# Patient Record
Sex: Female | Born: 1980 | Race: White | Hispanic: No | Marital: Married | Smoking: Never smoker
Health system: Southern US, Community
[De-identification: ages and names within clinical notes are randomized; demographics above are authoritative.]

## PROBLEM LIST (undated history)

## (undated) DIAGNOSIS — O09523 Supervision of elderly multigravida, third trimester: Secondary | ICD-10-CM

## (undated) DIAGNOSIS — O039 Complete or unspecified spontaneous abortion without complication: Secondary | ICD-10-CM

## (undated) DIAGNOSIS — O26643 Intrahepatic cholestasis of pregnancy, third trimester: Secondary | ICD-10-CM

## (undated) DIAGNOSIS — Z98891 History of uterine scar from previous surgery: Secondary | ICD-10-CM

## (undated) DIAGNOSIS — D649 Anemia, unspecified: Secondary | ICD-10-CM

## (undated) DIAGNOSIS — E039 Hypothyroidism, unspecified: Secondary | ICD-10-CM

## (undated) DIAGNOSIS — K219 Gastro-esophageal reflux disease without esophagitis: Secondary | ICD-10-CM

## (undated) DIAGNOSIS — E079 Disorder of thyroid, unspecified: Secondary | ICD-10-CM

## (undated) DIAGNOSIS — O163 Unspecified maternal hypertension, third trimester: Secondary | ICD-10-CM

## (undated) HISTORY — PX: OTHER SURGICAL HISTORY: SHX169

---

## 2008-04-14 DIAGNOSIS — E039 Hypothyroidism, unspecified: Secondary | ICD-10-CM | POA: Insufficient documentation

## 2019-02-20 ENCOUNTER — Ambulatory Visit
Admission: EM | Admit: 2019-02-20 | Discharge: 2019-02-20 | Disposition: A | Discharge status: Home / Self Care | Payer: PRIVATE HEALTH INSURANCE

## 2019-02-20 ENCOUNTER — Encounter: Payer: Self-pay | Admitting: Gynecology

## 2019-02-20 ENCOUNTER — Ambulatory Visit (INDEPENDENT_AMBULATORY_CARE_PROVIDER_SITE_OTHER): Payer: PRIVATE HEALTH INSURANCE

## 2019-02-20 ENCOUNTER — Other Ambulatory Visit: Payer: Self-pay

## 2019-02-20 DIAGNOSIS — M7531 Calcific tendinitis of right shoulder: Secondary | ICD-10-CM | POA: Diagnosis not present

## 2019-02-20 DIAGNOSIS — M25511 Pain in right shoulder: Secondary | ICD-10-CM | POA: Diagnosis not present

## 2019-02-20 DIAGNOSIS — Y9342 Activity, yoga: Secondary | ICD-10-CM | POA: Diagnosis not present

## 2019-02-20 HISTORY — DX: Complete or unspecified spontaneous abortion without complication: O03.9

## 2019-02-20 HISTORY — DX: Disorder of thyroid, unspecified: E07.9

## 2019-02-20 MED ORDER — MELOXICAM 15 MG PO TABS: 15.0000 mg | ORAL_TABLET | Freq: Every day | ORAL | 0 refills | Status: DC

## 2019-02-20 NOTE — ED Provider Notes (Signed)
[facility]    CSN: [id number] Arrival date & time: 02/20/19  2035     History   Chief Complaint Chief Complaint  Patient presents with  . Arm Pain    HPI Alexandra [name] is a 38 y.o. female.   HPI  38 year old female presents with a right shoulder pain.  She states that she was at yoga class around 3 days ago when she came home noticed pain in her right upper arm.  She indicates over the anterior biceps but there is no tenderness there.  Difficulty abducting her arm and also extending her shoulder.  No numbness or tingling.  She states that her neck is nice and comfortable.  Did not have a specific injury during yoga class and cannot remember anything other than yoga class which might have caused her to have her pain.        Past Medical History:  Diagnosis Date  . Miscarriage   . Thyroid disease     There are no active problems to display for this patient.   History reviewed. No pertinent surgical history.  OB History   No obstetric history on file.      Home Medications    Prior to Admission medications   Medication Sig Start Date End Date Taking? Authorizing Provider  levothyroxine (SYNTHROID, LEVOTHROID) 75 MCG tablet Take by mouth. 04/19/18 09/20/19 Yes [provider]  meloxicam (MOBIC) 15 MG tablet Take 1 tablet (15 mg total) by mouth daily. 02/20/19   Lutricia Feil, PA-C    Family History Family History  Problem Relation Age of Onset  . Hypertension Father     Social History Social History   Tobacco Use  . Smoking status: Never Smoker  . Smokeless tobacco: Never Used  Substance Use Topics  . Alcohol use: Never    Frequency: Never  . Drug use: Never     Allergies   Patient has no known allergies.   Review of Systems Review of Systems  Constitutional: Positive for activity change. Negative for appetite change, chills, fatigue and fever.  Musculoskeletal: Positive for myalgias.  All other systems reviewed and are  negative.    Physical Exam Triage Vital Signs ED Triage Vitals  Enc Vitals Group     BP 02/20/19 0848 133/87     Pulse Rate 02/20/19 0848 85     Resp 02/20/19 0848 16     Temp 02/20/19 0848 98 F (36.7 C)     Temp Source 02/20/19 0848 Oral     SpO2 02/20/19 0848 100 %     Weight 02/20/19 0848 188 lb (85.3 kg)     Height 02/20/19 0848 5\' 5"  (1.651 m)     Head Circumference --      Peak Flow --      Pain Score 02/20/19 0847 7     Pain Loc --      Pain Edu? --      Excl. in GC? --    No data found.  Updated Vital Signs BP 133/87 (BP Location: Left Arm)   Pulse 85   Temp 98 F (36.7 C) (Oral)   Resp 16   Ht 5\' 5"  (1.651 m)   Wt 188 lb (85.3 kg)   LMP 01/15/2019 Comment: miscarriage  SpO2 100%   BMI 31.28 kg/m   Visual Acuity Right Eye Distance:   Left Eye Distance:   Bilateral Distance:    Right Eye Near:   Left Eye Near:  Bilateral Near:     Physical Exam Vitals signs and nursing note reviewed.  Constitutional:      General: She is not in acute distress.    Appearance: Normal appearance. She is not ill-appearing, toxic-appearing or diaphoretic.  HENT:     Head: Normocephalic and atraumatic.     Nose: Nose normal.     Mouth/Throat:     Mouth: Mucous membranes are moist.     Pharynx: Oropharynx is clear.  Eyes:     General:        Right eye: No discharge.        Left eye: No discharge.     Conjunctiva/sclera: Conjunctivae normal.  Neck:     Musculoskeletal: Normal range of motion and neck supple. No muscular tenderness.  Musculoskeletal:        General: Tenderness present.     Comments: Exam of the right shoulder shows range of motion.  Discomfort at the extremes of external rotation particularly with extension and abduction.  She has no tenderness or pain along the clavicle or the [facility].  There is no subacromial tenderness.  She has no tenderness along the biceps.  Resisted flexion does not reproduce her pain.  Negative arm drop test.  She has a  negative empty can although she does have discomfort but is able to hold against resistance.  Skin:    General: Skin is warm and dry.  Neurological:     General: No focal deficit present.     Mental Status: She is alert and oriented to person, place, and time.  Psychiatric:        Mood and Affect: Mood normal.        Behavior: Behavior normal.        Thought Content: Thought content normal.        Judgment: Judgment normal.      UC Treatments / Results  Labs (all labs ordered are listed, but only abnormal results are displayed) Labs Reviewed - No data to display  EKG None  Radiology Dg Shoulder Right  Result Date: 02/20/2019 CLINICAL DATA:  Atraumatic right shoulder pain EXAM: RIGHT SHOULDER - 2+ VIEW COMPARISON:  None. FINDINGS: Calcification in the region of the rotator cuff. No fracture, degenerative spurring, or malalignment. IMPRESSION: Calcific tendinitis of the rotator cuff. Electronically Signed   By: Marnee SpringJonathon  Watts M.D.   On: 02/20/2019 10:44    Procedures Procedures (including critical care time)  Medications Ordered in UC Medications - No data to display  Initial Impression / Assessment and Plan / UC Course  I have reviewed the triage vital signs and the nursing notes.  Pertinent labs & imaging results that were available during my care of the patient were reviewed by me and considered in my medical decision making (see chart for details).   Patient has a right rotator cuff calcific tendinitis.  Ice as necessary for comfort.  Warm pendulum exercises until she has full use of her shoulder back.  Her on Mobic with gastric cautions.  Follow-up with orthopedic if she is not improving   Final Clinical Impressions(s) / UC Diagnoses   Final diagnoses:  Calcific tendinitis of right shoulder     Discharge Instructions     Apply ice 20 minutes out of every 2 hours 4-5 times daily for comfort.  Perform by pendulum exercises as described to you 3-4 times daily for 1  to 2 minutes each time.  Avoid symptoms as much as possible.    ED  Prescriptions    Medication Sig Dispense Auth. Provider   meloxicam (MOBIC) 15 MG tablet Take 1 tablet (15 mg total) by mouth daily. 30 tablet Lutricia Feil, PA-C     Controlled Substance Prescriptions [place] Controlled Substance Registry consulted? Not Applicable   Lutricia Feil, PA-C 02/20/19 1109

## 2019-02-20 NOTE — Discharge Instructions (Addendum)
Apply ice 20 minutes out of every 2 hours 4-5 times daily for comfort.  Perform by pendulum exercises as described to you 3-4 times daily for 1 to 2 minutes each time.  Avoid symptoms as much as possible.

## 2019-02-20 NOTE — ED Triage Notes (Signed)
Per patient at yoga class x 3 days ago. Per patient after getting home notice pain in right upper arm.

## 2019-06-09 ENCOUNTER — Other Ambulatory Visit: Payer: PRIVATE HEALTH INSURANCE

## 2019-06-09 ENCOUNTER — Ambulatory Visit (INDEPENDENT_AMBULATORY_CARE_PROVIDER_SITE_OTHER)
Admission: RE | Admit: 2019-06-09 | Discharge: 2019-06-09 | Disposition: A | Discharge status: Home / Self Care | Payer: PRIVATE HEALTH INSURANCE | Source: Ambulatory Visit

## 2019-06-09 ENCOUNTER — Telehealth: Payer: Self-pay | Admitting: *Deleted

## 2019-06-09 DIAGNOSIS — R197 Diarrhea, unspecified: Secondary | ICD-10-CM | POA: Diagnosis not present

## 2019-06-09 DIAGNOSIS — Z20822 Contact with and (suspected) exposure to covid-19: Secondary | ICD-10-CM

## 2019-06-09 DIAGNOSIS — R059 Cough, unspecified: Secondary | ICD-10-CM

## 2019-06-09 DIAGNOSIS — R05 Cough: Secondary | ICD-10-CM | POA: Diagnosis not present

## 2019-06-09 MED ORDER — CETIRIZINE HCL 10 MG PO TABS: 10.0000 mg | ORAL_TABLET | Freq: Every day | ORAL | 0 refills | Status: DC

## 2019-06-09 MED ORDER — ONDANSETRON 4 MG PO TBDP: 4.0000 mg | ORAL_TABLET | Freq: Three times a day (TID) | ORAL | 0 refills | Status: DC | PRN

## 2019-06-09 NOTE — Discharge Instructions (Addendum)
We are sending you for COVID testing They will be calling you to set up a time Make sure you are quarantining and taking precautions until we get your results This could be allergy related as we spoke about.  You could try taking an allergy medicine over-the-counter to include Zyrtec or Allegra Zofran as needed for nausea, vomiting Make sure drinking fluids and staying hydrated For continued or worsening symptoms you need to be seen in person

## 2019-06-09 NOTE — Telephone Encounter (Signed)
-----   Message from Orvan July, NP sent at 06/09/2019  2:38 PM EDT ----- Patient with dry cough, diarrhea and body aches Lives in [place] would like [place] testing site

## 2019-06-09 NOTE — ED Provider Notes (Signed)
Virtual Visit via Video Note:  Alexandra [name]  initiated request for Telemedicine visit with [facility]gent [facility] team. I connected with Alexandra [name]  on 06/09/2019 at 2:41 PM  for a synchronized telemedicine visit using a video enabled HIPPA compliant telemedicine application. I verified that I am speaking with Alexandra [name]  using two identifiers. Alexandra July, NP  was physically located in a [facility] Urgent care site and Alexandra [name] was located at a different location.   The limitations of evaluation and management by telemedicine as well as the availability of in-person appointments were discussed. Patient was informed that she  may incur a bill ( including co-pay) for this virtual visit encounter. Alexandra [name]  expressed understanding and gave verbal consent to proceed with virtual visit.     History of Present Illness:Alexandra [name]  is a 38 y.o. female presents with upper abdominal cramping, diarrhea, dry cough, back pain, nausea.  Symptoms have been present, waxing waning over the past 3 days.  She has also had some postnasal drip, mild itchy scratchy throat.  Denies any history of allergies.  No vomiting.  No recent sick contacts or recent traveling.  She has not take anything for her symptoms.  Denies any known COVID exposure.  Past Medical History:  Diagnosis Date  . Miscarriage   . Thyroid disease     No Known Allergies      Observations/Objective:GENERAL APPEARANCE: Well developed, well nourished, alert and cooperative, and appears to be in no acute distress. HEAD: normocephalic. Non labored breathing, no dyspnea or distress Skin: Skin normal color  PSYCHIATRIC: The mental examination revealed the patient was oriented to person, place, and time. The patient was able to demonstrate good judgement and reason, without hallucinations, abnormal affect or abnormal behaviors during the examination. Patient is not suicidal     Assessment and Plan: Based on dry cough and  diarrhea and other symptoms we will send for COVID testing.  Not convinced of this.  Not convinced of any infectious disease.  Recommended that if symptoms continue she will need to be seen in person. Symptoms could be allergy related Recommended taking some Zyrtec daily to see if this helps.  Also sent Zofran for nausea, vomiting Quarantine precautions given   Follow Up Instructions: Follow up as needed for continued or worsening symptoms     I discussed the assessment and treatment plan with the patient. The patient was provided an opportunity to ask questions and all were answered. The patient agreed with the plan and demonstrated an understanding of the instructions.   The patient was advised to call back or seek an in-person evaluation if the symptoms worsen or if the condition fails to improve as anticipated.      Alexandra July, NP  06/09/2019 2:41 PM         Alexandra July, NP 06/09/19 0102

## 2019-06-09 NOTE — Telephone Encounter (Signed)
Scheduled patient for appointment today at 3:15 pm at [facility].  Testing protocol reviewed.

## 2019-06-13 LAB — NOVEL CORONAVIRUS, NAA: SARS-CoV-2, NAA: NOT DETECTED

## 2019-12-13 LAB — OB RESULTS CONSOLE RUBELLA ANTIBODY, IGM: Rubella: IMMUNE

## 2019-12-13 LAB — OB RESULTS CONSOLE VARICELLA ZOSTER ANTIBODY, IGG: Varicella: IMMUNE

## 2019-12-13 LAB — OB RESULTS CONSOLE HEPATITIS B SURFACE ANTIGEN: Hepatitis B Surface Ag: NEGATIVE

## 2020-03-09 ENCOUNTER — Ambulatory Visit: Payer: PRIVATE HEALTH INSURANCE | Attending: Internal Medicine

## 2020-03-09 DIAGNOSIS — Z23 Encounter for immunization: Secondary | ICD-10-CM

## 2020-03-09 NOTE — Progress Notes (Signed)
   Covid-19 Vaccination Clinic  Name:  Roderica Cathell    MRN: [id number] DOB: Feb 10, 1981  03/09/2020  Ms. Launer was observed post Covid-19 immunization for 15 minutes without incident. She was provided with Vaccine Information Sheet and instruction to access the V-Safe system.   Ms. Edgington was instructed to call 911 with any severe reactions post vaccine: Marland Kitchen Difficulty breathing  . Swelling of face and throat  . A fast heartbeat  . A bad rash all over body  . Dizziness and weakness   Immunizations Administered    Name Date Dose VIS Date Route   Pfizer COVID-19 Vaccine 03/09/2020 10:25 AM 0.3 mL 11/25/2019 Intramuscular   Manufacturer: [employer], [employer]   Lot: [id number]   NDC: [address]-0

## 2020-04-03 ENCOUNTER — Ambulatory Visit: Payer: PRIVATE HEALTH INSURANCE | Attending: Internal Medicine

## 2020-04-03 DIAGNOSIS — Z23 Encounter for immunization: Secondary | ICD-10-CM

## 2020-04-03 NOTE — Progress Notes (Signed)
   Covid-19 Vaccination Clinic  Name:  Alexandra [name]    MRN: [id number] DOB: 1981-05-04  04/03/2020  [name] was observed post Covid-19 immunization for 15 minutes without incident. She was provided with Vaccine Information Sheet and instruction to access the V-Safe system.   [name] was instructed to call 911 with any severe reactions post vaccine: Marland Kitchen Difficulty breathing  . Swelling of face and throat  . A fast heartbeat  . A bad rash all over body  . Dizziness and weakness   Immunizations Administered    Name Date Dose VIS Date Route   Pfizer COVID-19 Vaccine 04/03/2020  8:39 AM 0.3 mL 02/08/2019 Intramuscular   Manufacturer: [employer], [employer]   Lot: [id number]   NDC: [address]-2

## 2020-05-22 ENCOUNTER — Other Ambulatory Visit: Payer: Self-pay

## 2020-05-22 ENCOUNTER — Observation Stay
Admission: RE | Admit: 2020-05-22 | Discharge: 2020-05-22 | Disposition: A | Discharge status: Home / Self Care | Payer: Commercial Managed Care - PPO | Attending: Obstetrics and Gynecology | Admitting: Obstetrics and Gynecology

## 2020-05-22 DIAGNOSIS — Z8249 Family history of ischemic heart disease and other diseases of the circulatory system: Secondary | ICD-10-CM | POA: Diagnosis not present

## 2020-05-22 DIAGNOSIS — E039 Hypothyroidism, unspecified: Secondary | ICD-10-CM | POA: Diagnosis not present

## 2020-05-22 DIAGNOSIS — Z79890 Hormone replacement therapy (postmenopausal): Secondary | ICD-10-CM | POA: Insufficient documentation

## 2020-05-22 DIAGNOSIS — O99283 Endocrine, nutritional and metabolic diseases complicating pregnancy, third trimester: Secondary | ICD-10-CM | POA: Diagnosis not present

## 2020-05-22 DIAGNOSIS — Z3A35 35 weeks gestation of pregnancy: Secondary | ICD-10-CM | POA: Insufficient documentation

## 2020-05-22 DIAGNOSIS — O133 Gestational [pregnancy-induced] hypertension without significant proteinuria, third trimester: Secondary | ICD-10-CM | POA: Diagnosis not present

## 2020-05-22 DIAGNOSIS — R030 Elevated blood-pressure reading, without diagnosis of hypertension: Secondary | ICD-10-CM | POA: Diagnosis present

## 2020-05-22 DIAGNOSIS — Z79899 Other long term (current) drug therapy: Secondary | ICD-10-CM | POA: Diagnosis not present

## 2020-05-22 DIAGNOSIS — O0993 Supervision of high risk pregnancy, unspecified, third trimester: Secondary | ICD-10-CM | POA: Insufficient documentation

## 2020-05-22 DIAGNOSIS — Z7982 Long term (current) use of aspirin: Secondary | ICD-10-CM | POA: Insufficient documentation

## 2020-05-22 HISTORY — DX: Hypothyroidism, unspecified: E03.9

## 2020-05-22 LAB — COMPREHENSIVE METABOLIC PANEL
ALT: 11 U/L (ref 0–44)
AST: 18 U/L (ref 15–41)
Albumin: 3 g/dL — ABNORMAL LOW (ref 3.5–5.0)
Alkaline Phosphatase: 109 U/L (ref 38–126)
Anion gap: 6 (ref 5–15)
BUN: 9 mg/dL (ref 6–20)
CO2: 21 mmol/L — ABNORMAL LOW (ref 22–32)
Calcium: 8.7 mg/dL — ABNORMAL LOW (ref 8.9–10.3)
Chloride: 109 mmol/L (ref 98–111)
Creatinine, Ser: 0.76 mg/dL (ref 0.44–1.00)
GFR calc Af Amer: >60 mL/min (ref >60)
GFR calc non Af Amer: >60 mL/min (ref >60)
Glucose, Bld: 83 mg/dL (ref 70–99)
Potassium: 3.9 mmol/L (ref 3.5–5.1)
Sodium: 136 mmol/L (ref 135–145)
Total Bilirubin: 0.5 mg/dL (ref 0.3–1.2)
Total Protein: 5.6 g/dL — ABNORMAL LOW (ref 6.5–8.1)

## 2020-05-22 LAB — PROTEIN / CREATININE RATIO, URINE
Creatinine, Urine: 89 mg/dL
Total Protein, Urine: <6 mg/dL

## 2020-05-22 LAB — CBC
HCT: 34.8 % — ABNORMAL LOW (ref 36.0–46.0)
Hemoglobin: 12 g/dL (ref 12.0–15.0)
MCH: 31.7 pg (ref 26.0–34.0)
MCHC: 34.5 g/dL (ref 30.0–36.0)
MCV: 91.8 fL (ref 80.0–100.0)
Platelets: 217 10*3/uL (ref 150–400)
RBC: 3.79 MIL/uL — ABNORMAL LOW (ref 3.87–5.11)
RDW: 12.4 % (ref 11.5–15.5)
WBC: 7.2 10*3/uL (ref 4.0–10.5)
nRBC: 0 % (ref 0.0–0.2)

## 2020-05-22 MED ORDER — DOCUSATE SODIUM 100 MG PO CAPS: 100.0000 mg | ORAL_CAPSULE | Freq: Every day | ORAL | Status: DC

## 2020-05-22 MED ORDER — ACETAMINOPHEN 325 MG PO TABS: 650.0000 mg | ORAL_TABLET | ORAL | Status: DC | PRN

## 2020-05-22 MED ORDER — CALCIUM CARBONATE ANTACID 500 MG PO CHEW: 2.0000 | CHEWABLE_TABLET | ORAL | Status: DC | PRN

## 2020-05-22 MED ORDER — ZOLPIDEM TARTRATE 5 MG PO TABS: 5.0000 mg | ORAL_TABLET | Freq: Every evening | ORAL | Status: DC | PRN

## 2020-05-22 MED ORDER — PRENATAL MULTIVITAMIN CH: 1.0000 | ORAL_TABLET | Freq: Every day | ORAL | Status: DC

## 2020-05-22 NOTE — Discharge Summary (Signed)
Alexandra [name] is a 39 y.o. female. She is at [redacted]w[redacted]d gestation. Patient's last menstrual period was 09/17/2019. Estimated Date of Delivery: 06/23/20  Prenatal care site: [facility]  Current pregnancy complicated by:  1. Gestational Hypertension noted at [redacted]w[redacted]d  NST and Growth u/s done 05/18/20  NST and AFI on 05/22/20  2. IVF Pregnancy  Embryo transfer on 10/06/2019, EDD 06/23/2020  PGS performed, negative  [x ] fetal echo 22w- WNL on 02/16/20 2. Prepregnancy BMI 32   11-20 pounds TWG  P/C ratio, A1c, and CMP added to new OB labs 3. Advanced maternal age  Will be 58 at delivery  PGS normal  Baby ASA for risk factors- restart 1/26 4. Thyroid disease  Currently taking 151mcg Synthroid daily  [x]  TSH first trimester: 0.389 at [redacted]w[redacted]d 12/13/2019  Recheck at next visit per CCW [1/26: 1.033 ]   [x]  TSH second trimester 02/07/20 1.661  03/22/2020 1.111 [redacted]w[redacted]d  [ x] TSH third trimester 1.209  5. --> gestational hypertension  05/02/2020: Normal P/C 94, CMP and CBC  Given Pre-e precautions.   05/11/2020: CBC, CMP, and P/C repeated  Growth scan ordered for next visit 05/11/2020  Reviewed delivery at [redacted]w[redacted]d 6. Elevated 1h OGTT  153  3h WNL 98, 169, 129, 92  Chief complaint: sent from office for elevated BP.    S: Resting comfortably. no CTX, no VB.no LOF,  Active fetal movement.  Denies: HA, visual changes, SOB, or RUQ/epigastric pain  Maternal Medical History:   Past Medical History:  Diagnosis Date  . Hypothyroidism   . Miscarriage   . Thyroid disease     Past Surgical History:  Procedure Laterality Date  . Fertility Treatments      No Known Allergies  Prior to Admission medications   Medication Sig Start Date End Date Taking? Authorizing Provider  aspirin 81 MG chewable tablet Chew by mouth daily.   Yes [provider]  levothyroxine (SYNTHROID) 100 MCG tablet Take 100 mcg by mouth daily before breakfast.   Yes [provider]   Prenatal Vit-Fe Fumarate-FA (PRENATAL MULTIVITAMIN) TABS tablet Take 1 tablet by mouth daily at 12 noon.   Yes [provider]  cetirizine (ZYRTEC) 10 MG tablet Take 1 tablet (10 mg total) by mouth daily. Patient not taking: Reported on 05/22/2020 06/09/19   Loura Halt A, NP  levothyroxine (SYNTHROID, LEVOTHROID) 75 MCG tablet Take by mouth. 04/19/18 09/20/19  [provider]  meloxicam (MOBIC) 15 MG tablet Take 1 tablet (15 mg total) by mouth daily. Patient not taking: Reported on 05/22/2020 02/20/19   Crecencio Mc P, PA-C  ondansetron (ZOFRAN ODT) 4 MG disintegrating tablet Take 1 tablet (4 mg total) by mouth every 8 (eight) hours as needed for nausea or vomiting. Patient not taking: Reported on 05/22/2020 06/09/19   Orvan July, NP      Social History: She  reports that she has never smoked. She has never used smokeless tobacco. She reports that she does not drink alcohol or use drugs.  Family History: family history includes Hypertension in her father.   Review of Systems: A full review of systems was performed and negative except as noted in the HPI.     O:  BP (!) 129/93 (BP Location: Left Arm)   Pulse 63   Resp 17   LMP 09/17/2019  Results for orders placed or performed during the hospital encounter of 05/22/20 (from the past 48 hour(s))  Comprehensive metabolic panel   Collection Time: 05/22/20 12:57  PM  Result Value Ref Range   Sodium 136 135 - 145 mmol/L   Potassium 3.9 3.5 - 5.1 mmol/L   Chloride 109 98 - 111 mmol/L   CO2 21 (L) 22 - 32 mmol/L   Glucose, Bld 83 70 - 99 mg/dL   BUN 9 6 - 20 mg/dL   Creatinine, Ser 6.43 0.44 - 1.00 mg/dL   Calcium 8.7 (L) 8.9 - 10.3 mg/dL   Total Protein 5.6 (L) 6.5 - 8.1 g/dL   Albumin 3.0 (L) 3.5 - 5.0 g/dL   AST 18 15 - 41 U/L   ALT 11 0 - 44 U/L   Alkaline Phosphatase 109 38 - 126 U/L   Total Bilirubin 0.5 0.3 - 1.2 mg/dL   GFR calc non Af Amer >60 >60 mL/min   GFR calc Af Amer >60 >60 mL/min   Anion gap 6 5 - 15   CBC on admission   Collection Time: 05/22/20 12:57 PM  Result Value Ref Range   WBC 7.2 4.0 - 10.5 K/uL   RBC 3.79 (L) 3.87 - 5.11 MIL/uL   Hemoglobin 12.0 12.0 - 15.0 g/dL   HCT 32.9 (L) 51.8 - 84.1 %   MCV 91.8 80.0 - 100.0 fL   MCH 31.7 26.0 - 34.0 pg   MCHC 34.5 30.0 - 36.0 g/dL   RDW 66.0 63.0 - 16.0 %   Platelets 217 150 - 400 K/uL   nRBC 0.0 0.0 - 0.2 %  Protein / creatinine ratio, urine   Collection Time: 05/22/20  1:01 PM  Result Value Ref Range   Creatinine, Urine 89 mg/dL   Total Protein, Urine <6 mg/dL   Protein Creatinine Ratio        0.00 - 0.15 mg/mg[Cre]     Constitutional: NAD, AAOx3  HE/ENT: extraocular movements grossly intact, moist mucous membranes CV: RRR PULM: nl respiratory effort, CTABL     Abd: gravid, non-tender, non-distended, soft      Ext: Non-tender, Nonedematous   Psych: mood appropriate, speech normal Pelvic: deferred  Fetal  monitoring: Cat I Appropriate for GA Baseline: 135bpm Variability: moderate Accelerations: present x >2 Decelerations absent Time    A/P: 39 y.o. [redacted]w[redacted]d here for antenatal surveillance for GHTN  Principle Diagnosis:  High risk pregnancy in third trimester   Labor: not present.   Fetal Wellbeing: Reassuring Cat 1 tracing with Reactive NST   Neg Pre-e labs, mild range BPs.   D/c home stable, precautions reviewed, follow-up as scheduled in 3d for NST/PNC.    Randa Ngo, CNM 05/22/2020  2:40 PM

## 2020-05-29 ENCOUNTER — Other Ambulatory Visit: Payer: Self-pay

## 2020-05-29 LAB — OB RESULTS CONSOLE GC/CHLAMYDIA
Chlamydia: NEGATIVE
Gonorrhea: NEGATIVE

## 2020-05-29 LAB — OB RESULTS CONSOLE GBS: GBS: NEGATIVE

## 2020-05-29 LAB — OB RESULTS CONSOLE HIV ANTIBODY (ROUTINE TESTING): HIV: NONREACTIVE

## 2020-05-29 NOTE — Progress Notes (Signed)
[id number] at [redacted]w[redacted]d, dated by FET Scheduled for induction of labor for gestational HTN on 06/04/20.   Prenatal provider: [facility] Pregnancy complicated by: 1. Gestational HTN 2. IVF pregnancy  3. Prepregnancy BMI 32 4. AMA 5. Thyroid disease  6. Elevated 1hr GTT - 3hr WNL  Prenatal Labs: Blood type/Rh O pos  Antibody screen neg  Rubella Immune  Varicella Immune  RPR NR  HBsAg Neg  HIV NR  GC neg  Chlamydia neg  Genetic screening negative  1 hour GTT 153  3 hour GTT 98, 169, 129, 92  GBS Pending    Flu: 09/16/2019 Tdap: 04/18/2020 Covid Vaccine: 2nd vaccine given 04/03/2020 Contraception: none - no fallopian tubes, used IVF to achieve pregnancy  Feeding preference: breast   ____ Margaretmary Eddy, CNM Certified Nurse Midwife [facility]

## 2020-06-01 ENCOUNTER — Other Ambulatory Visit: Payer: Self-pay

## 2020-06-01 ENCOUNTER — Other Ambulatory Visit
Admission: RE | Admit: 2020-06-01 | Discharge: 2020-06-01 | Disposition: A | Discharge status: Home / Self Care | Payer: Commercial Managed Care - PPO | Source: Ambulatory Visit | Attending: Obstetrics and Gynecology | Admitting: Obstetrics and Gynecology

## 2020-06-01 DIAGNOSIS — Z20822 Contact with and (suspected) exposure to covid-19: Secondary | ICD-10-CM | POA: Insufficient documentation

## 2020-06-01 DIAGNOSIS — Z01812 Encounter for preprocedural laboratory examination: Secondary | ICD-10-CM | POA: Insufficient documentation

## 2020-06-01 LAB — SARS CORONAVIRUS 2 (TAT 6-24 HRS): SARS Coronavirus 2: NEGATIVE

## 2020-06-02 ENCOUNTER — Encounter: Payer: Self-pay | Admitting: Obstetrics and Gynecology

## 2020-06-02 ENCOUNTER — Inpatient Hospital Stay
Admission: AD | Admit: 2020-06-02 | Discharge: 2020-06-06 | DRG: 787 | Disposition: A | Discharge status: Home / Self Care | Payer: Commercial Managed Care - PPO | Attending: Certified Nurse Midwife | Admitting: Certified Nurse Midwife

## 2020-06-02 DIAGNOSIS — D62 Acute posthemorrhagic anemia: Secondary | ICD-10-CM | POA: Diagnosis not present

## 2020-06-02 DIAGNOSIS — Z20822 Contact with and (suspected) exposure to covid-19: Secondary | ICD-10-CM | POA: Diagnosis present

## 2020-06-02 DIAGNOSIS — Z3A37 37 weeks gestation of pregnancy: Secondary | ICD-10-CM | POA: Diagnosis not present

## 2020-06-02 DIAGNOSIS — Z7982 Long term (current) use of aspirin: Secondary | ICD-10-CM

## 2020-06-02 DIAGNOSIS — O9081 Anemia of the puerperium: Secondary | ICD-10-CM | POA: Diagnosis not present

## 2020-06-02 DIAGNOSIS — O134 Gestational [pregnancy-induced] hypertension without significant proteinuria, complicating childbirth: Secondary | ICD-10-CM | POA: Diagnosis present

## 2020-06-02 DIAGNOSIS — Z9889 Other specified postprocedural states: Secondary | ICD-10-CM

## 2020-06-02 DIAGNOSIS — O139 Gestational [pregnancy-induced] hypertension without significant proteinuria, unspecified trimester: Secondary | ICD-10-CM | POA: Diagnosis present

## 2020-06-02 LAB — COMPREHENSIVE METABOLIC PANEL
ALT: 11 U/L (ref 0–44)
AST: 17 U/L (ref 15–41)
Albumin: 2.9 g/dL — ABNORMAL LOW (ref 3.5–5.0)
Alkaline Phosphatase: 139 U/L — ABNORMAL HIGH (ref 38–126)
Anion gap: 8 (ref 5–15)
BUN: 12 mg/dL (ref 6–20)
CO2: 20 mmol/L — ABNORMAL LOW (ref 22–32)
Calcium: 8.8 mg/dL — ABNORMAL LOW (ref 8.9–10.3)
Chloride: 106 mmol/L (ref 98–111)
Creatinine, Ser: 0.8 mg/dL (ref 0.44–1.00)
GFR calc Af Amer: >60 mL/min (ref >60)
GFR calc non Af Amer: >60 mL/min (ref >60)
Glucose, Bld: 104 mg/dL — ABNORMAL HIGH (ref 70–99)
Potassium: 4 mmol/L (ref 3.5–5.1)
Sodium: 134 mmol/L — ABNORMAL LOW (ref 135–145)
Total Bilirubin: 0.6 mg/dL (ref 0.3–1.2)
Total Protein: 5.7 g/dL — ABNORMAL LOW (ref 6.5–8.1)

## 2020-06-02 LAB — CBC
HCT: 35.6 % — ABNORMAL LOW (ref 36.0–46.0)
Hemoglobin: 12.2 g/dL (ref 12.0–15.0)
MCH: 31.6 pg (ref 26.0–34.0)
MCHC: 34.3 g/dL (ref 30.0–36.0)
MCV: 92.2 fL (ref 80.0–100.0)
Platelets: 222 10*3/uL (ref 150–400)
RBC: 3.86 MIL/uL — ABNORMAL LOW (ref 3.87–5.11)
RDW: 12.3 % (ref 11.5–15.5)
WBC: 10.1 10*3/uL (ref 4.0–10.5)
nRBC: 0 % (ref 0.0–0.2)

## 2020-06-02 LAB — PROTEIN / CREATININE RATIO, URINE
Creatinine, Urine: 181 mg/dL
Protein Creatinine Ratio: 0.1 mg/mg{Cre} (ref 0.00–0.15)
Total Protein, Urine: 19 mg/dL

## 2020-06-02 LAB — TYPE AND SCREEN
ABO/RH(D): O POS
Antibody Screen: NEGATIVE

## 2020-06-02 LAB — RPR: RPR Ser Ql: NONREACTIVE

## 2020-06-02 MED ORDER — OXYTOCIN BOLUS FROM INFUSION: 333.0000 mL | Freq: Once | INTRAVENOUS | Status: DC

## 2020-06-02 MED ORDER — MISOPROSTOL 25 MCG QUARTER TABLET
25.0000 ug | ORAL_TABLET | ORAL | Status: DC | PRN
  Administered 2020-06-02 (×2): 25 ug via VAGINAL

## 2020-06-02 MED ORDER — HYDRALAZINE HCL 20 MG/ML IJ SOLN: 10.0000 mg | INTRAMUSCULAR | Status: DC | PRN

## 2020-06-02 MED ORDER — LACTATED RINGERS IV SOLN
500.0000 mL | INTRAVENOUS | Status: DC | PRN
  Administered 2020-06-03: 500 mL via INTRAVENOUS

## 2020-06-02 MED ORDER — BUTORPHANOL TARTRATE 1 MG/ML IJ SOLN
1.0000 mg | INTRAMUSCULAR | Status: DC | PRN
  Administered 2020-06-02 – 2020-06-03 (×2): 1 mg via INTRAVENOUS
  Filled 2020-06-02 (×2): qty 1

## 2020-06-02 MED ORDER — MAGNESIUM SULFATE 40 GM/1000ML IV SOLN
2.0000 g/h | INTRAVENOUS | Status: DC
  Administered 2020-06-02 – 2020-06-03 (×2): 2 g/h via INTRAVENOUS
  Filled 2020-06-02 (×3): qty 1000

## 2020-06-02 MED ORDER — LIDOCAINE HCL (PF) 1 % IJ SOLN
30.0000 mL | INTRAMUSCULAR | Status: DC | PRN
  Filled 2020-06-02: qty 30

## 2020-06-02 MED ORDER — LABETALOL HCL 5 MG/ML IV SOLN: 40.0000 mg | INTRAVENOUS | Status: DC | PRN

## 2020-06-02 MED ORDER — MAGNESIUM SULFATE BOLUS VIA INFUSION
4.0000 g | Freq: Once | INTRAVENOUS | Status: AC
  Administered 2020-06-02: 4 g via INTRAVENOUS
  Filled 2020-06-02: qty 1000

## 2020-06-02 MED ORDER — SOD CITRATE-CITRIC ACID 500-334 MG/5ML PO SOLN
30.0000 mL | ORAL | Status: DC | PRN
Start: 2020-06-02 — End: 2020-06-03
  Administered 2020-06-03: 30 mL via ORAL
  Filled 2020-06-02: qty 30

## 2020-06-02 MED ORDER — MISOPROSTOL 25 MCG QUARTER TABLET
25.0000 ug | ORAL_TABLET | ORAL | Status: DC | PRN
  Administered 2020-06-02 (×4): 25 ug via BUCCAL
  Filled 2020-06-02 (×6): qty 1

## 2020-06-02 MED ORDER — AMMONIA AROMATIC IN INHA
RESPIRATORY_TRACT | Status: AC
  Filled 2020-06-02: qty 10

## 2020-06-02 MED ORDER — SODIUM CHLORIDE (PF) 0.9 % IJ SOLN
INTRAMUSCULAR | Status: AC
  Filled 2020-06-02: qty 50

## 2020-06-02 MED ORDER — LACTATED RINGERS IV SOLN: INTRAVENOUS | Status: DC

## 2020-06-02 MED ORDER — ACETAMINOPHEN 500 MG PO TABS: 1000.0000 mg | ORAL_TABLET | Freq: Four times a day (QID) | ORAL | Status: DC | PRN

## 2020-06-02 MED ORDER — ONDANSETRON HCL 4 MG/2ML IJ SOLN: 4.0000 mg | Freq: Four times a day (QID) | INTRAMUSCULAR | Status: DC | PRN

## 2020-06-02 MED ORDER — CALCIUM GLUCONATE 10 % IV SOLN
INTRAVENOUS | Status: AC
  Filled 2020-06-02: qty 10

## 2020-06-02 MED ORDER — LABETALOL HCL 5 MG/ML IV SOLN
20.0000 mg | INTRAVENOUS | Status: DC | PRN
  Administered 2020-06-02: 20 mg via INTRAVENOUS
  Filled 2020-06-02: qty 4

## 2020-06-02 MED ORDER — MISOPROSTOL 200 MCG PO TABS
ORAL_TABLET | ORAL | Status: AC
  Filled 2020-06-02: qty 4

## 2020-06-02 MED ORDER — OXYTOCIN 10 UNIT/ML IJ SOLN
INTRAMUSCULAR | Status: AC
  Filled 2020-06-02: qty 2

## 2020-06-02 MED ORDER — OXYTOCIN-SODIUM CHLORIDE 30-0.9 UT/500ML-% IV SOLN
1.0000 m[IU]/min | INTRAVENOUS | Status: DC
  Administered 2020-06-02: 1 m[IU]/min via INTRAVENOUS
  Administered 2020-06-03: 6 m[IU]/min via INTRAVENOUS

## 2020-06-02 MED ORDER — MISOPROSTOL 25 MCG QUARTER TABLET
ORAL_TABLET | ORAL | Status: AC
  Administered 2020-06-02: 25 ug via VAGINAL
  Filled 2020-06-02: qty 1

## 2020-06-02 MED ORDER — OXYTOCIN-SODIUM CHLORIDE 30-0.9 UT/500ML-% IV SOLN
2.5000 [IU]/h | INTRAVENOUS | Status: DC
  Filled 2020-06-02 (×2): qty 500
  Filled 2020-06-02: qty 1000

## 2020-06-02 MED ORDER — TERBUTALINE SULFATE 1 MG/ML IJ SOLN: 0.2500 mg | Freq: Once | INTRAMUSCULAR | Status: DC | PRN

## 2020-06-02 MED ORDER — LABETALOL HCL 5 MG/ML IV SOLN: 80.0000 mg | INTRAVENOUS | Status: DC | PRN

## 2020-06-02 NOTE — H&P (Signed)
OB History & Physical   History of Present Illness:  Chief Complaint: Induction  HPI:  Alexandra [name] is a 39 y.o. G63P0020 female at [redacted]w[redacted]d dated by embryo transfer.  She presents to L&D for induction of labor for [facility]. On arrival, reports active FM, denies UCs, SROM or VB. Denies RUQ, HA or VD.     Pregnancy Issues: 1. Gestational HTN 2. IVF pregnancy  3. Prepregnancy BMI 32 4. AMA 5. Thyroid disease  6. Elevated 1hr GTT - 3hr WNL   Maternal Medical History:   Past Medical History:  Diagnosis Date  . Hypothyroidism   . Miscarriage   . Thyroid disease     Past Surgical History:  Procedure Laterality Date  . Fertility Treatments      No Known Allergies  Prior to Admission medications   Medication Sig Start Date End Date Taking? Authorizing Provider  aspirin 81 MG chewable tablet Chew by mouth daily.   Yes [provider]  levothyroxine (SYNTHROID) 100 MCG tablet Take 100 mcg by mouth daily before breakfast.   Yes [provider]  Prenatal Vit-Fe Fumarate-FA (PRENATAL MULTIVITAMIN) TABS tablet Take 1 tablet by mouth daily at 12 noon.   Yes [provider]     Prenatal care site: [place] History: She  reports that she has never smoked. She has never used smokeless tobacco. She reports that she does not drink alcohol and does not use drugs.  Family History: family history includes Hypertension in her father.   Review of Systems: A full review of systems was performed and negative except as noted in the HPI.     Physical Exam:  Vital Signs: BP (!) 152/99 (BP Location: Right Arm)   Pulse 74   Temp 98.7 F (37.1 C) (Oral)   Resp 20   Ht 5\' 5"  (1.651 m)   Wt 98.9 kg   LMP 09/17/2019   SpO2 99%   BMI 36.28 kg/m   - Severe range BPs on arrival General: no acute distress.  HEENT: normocephalic, atraumatic Heart: regular rate & rhythm.  No murmurs/rubs/gallops Lungs: clear to auscultation bilaterally, normal  respiratory effort Abdomen: soft, gravid, non-tender;  EFW: 7.5lbs Pelvic:   External: Normal external female genitalia  Cervix: FTP/thick per RN exam   Extremities: non-tender, symmetric, 2+ LE edema bilaterally.  DTRs: 2+  Neurologic: Alert & oriented x 3.    Results for orders placed or performed during the hospital encounter of 06/02/20 (from the past 24 hour(s))  CBC     Status: Abnormal   Collection Time: 06/02/20  1:47 AM  Result Value Ref Range   WBC 10.1 4.0 - 10.5 K/uL   RBC 3.86 (L) 3.87 - 5.11 MIL/uL   Hemoglobin 12.2 12.0 - 15.0 g/dL   HCT 35.6 (L) 36 - 46 %   MCV 92.2 80.0 - 100.0 fL   MCH 31.6 26.0 - 34.0 pg   MCHC 34.3 30.0 - 36.0 g/dL   RDW 12.3 11.5 - 15.5 %   Platelets 222 150 - 400 K/uL   nRBC 0.0 0.0 - 0.2 %  Comprehensive metabolic panel     Status: Abnormal   Collection Time: 06/02/20  1:47 AM  Result Value Ref Range   Sodium 134 (L) 135 - 145 mmol/L   Potassium 4.0 3.5 - 5.1 mmol/L   Chloride 106 98 - 111 mmol/L   CO2 20 (L) 22 - 32 mmol/L   Glucose, Bld 104 (H) 70 - 99 mg/dL  BUN 12 6 - 20 mg/dL   Creatinine, Ser 8.24 0.44 - 1.00 mg/dL   Calcium 8.8 (L) 8.9 - 10.3 mg/dL   Total Protein 5.7 (L) 6.5 - 8.1 g/dL   Albumin 2.9 (L) 3.5 - 5.0 g/dL   AST 17 15 - 41 U/L   ALT 11 0 - 44 U/L   Alkaline Phosphatase 139 (H) 38 - 126 U/L   Total Bilirubin 0.6 0.3 - 1.2 mg/dL   GFR calc non Af Amer >60 >60 mL/min   GFR calc Af Amer >60 >60 mL/min   Anion gap 8 5 - 15  Type and screen     Status: None (Preliminary result)   Collection Time: 06/02/20  1:47 AM  Result Value Ref Range   ABO/RH(D) PENDING    Antibody Screen PENDING    Sample Expiration      06/05/2020,2359 Performed at [facility], [address] [place], [place] [address]   Protein / creatinine ratio, urine     Status: None   Collection Time: 06/02/20  2:30 AM  Result Value Ref Range   Creatinine, Urine 181 mg/dL   Total Protein, Urine 19 mg/dL   Protein Creatinine Ratio  0.10 0.00 - 0.15 mg/mg[Cre]    Pertinent Results:  Prenatal Labs: Blood type/Rh  O pos  Antibody screen neg  Rubella Immune  Varicella Immune  RPR NR  HBsAg Neg  HIV NR  GC neg  Chlamydia neg  Genetic screening negative  1 hour GTT  153  3 hour GTT  [phone]  GBS  neg   FHT: 130bpm, mod variability, + accels, no decels TOCO: occasional irregular UC noted.    Cephalic by leopolds and confirmed with [place].  No results found.  Assessment:  Alexandra [name] is a 39 y.o. G52P0020 female at [redacted]w[redacted]d with IOL for GHTN.   Plan:  1. Admit to Labor & Delivery; consents reviewed and obtained - GHTN with severe BP on admission, s/p 1 dose of Labetalol 20mg  IV given. Magnesium started, 4gram bolus then 2gram per hour for Sz prophy.  - COVID pending, done on admission.  - Labs reviewed, WNL, P/C ratio 0.10  2. Fetal Well being  - Fetal Tracing: Cat I tracing - Group B Streptococcus ppx indicated: negative - Presentation: cephalic  confirmed by Leopolds and bedside   3. Routine OB: - Prenatal labs reviewed, as above - Rh O Pos - CBC, T&S, RPR on admit - Clear fluids, IVF  4. Induction of Labor -  Contractions: external toco in place -  Pelvis adequate for TOL -  Plan for induction with cytotec, foley balloon, pitocin.  -  Plan for continuous fetal monitoring  -  Maternal pain control as desired - Anticipate vaginal delivery  5. Post Partum Planning: - Infant feeding: breast - Contraception: n/a- IVF preg - tdap 04/18/20  06/18/20 Deolinda Frid, CNM 06/02/20 3:03 AM

## 2020-06-02 NOTE — Progress Notes (Signed)
Labor Progress Note  Krystyna Cleckley is a 39 y.o. G3P0020 at [redacted]w[redacted]d by embryo transfer admitted for induction of labor due to Hypertension with severe features on arrival.  Subjective:  Denies HA, VD or RUQ pain. Feeling some intermittent tightness and pressure, but no pain.  - has not been severe range since admission and IV Labetalol given x 1.   Objective: BP (!) 144/90    Pulse 86    Temp 98.3 F (36.8 C) (Oral)    Resp 16    Ht 5\' 5"  (1.651 m)    Wt 98.9 kg Comment: 218lbs   LMP 09/17/2019    SpO2 100%    BMI 36.28 kg/m  Notable VS details: reviewed, BPs remain 140-150/90s.   General: no acute distress.  HEENT: normocephalic, atraumatic Heart: regular rate & rhythm.  No murmurs/rubs/gallops Lungs: clear to auscultation bilaterally, normal respiratory effort Abdomen: soft, gravid, non-tender Extremities: non-tender, symmetric, 2+ LE edema bilaterally.  DTRs: 2+  Neurologic: Alert & oriented x 3.  Fetal Assessment: FHT:  FHR: 140 bpm, variability: moderate,  accelerations:  Present,  decelerations:  Absent  Category/reactivity:  Category I UC:   irregular, every 1-5 minutes, palp mild SVE: 1/50/-2, soft, midposition. Foley balloon placed and inflated with 58ml saline, pt tolerated well.  cytotec placed 43m vaginally.   Membrane status: intact Amniotic color: n/a   Intake/Output Summary (Last 24 hours) at 06/02/2020 2014 Last data filed at 06/02/2020 1806 Gross per 24 hour  Intake 2314.44 ml  Output 3025 ml  Net -710.56 ml    Labs: Lab Results  Component Value Date   WBC 10.1 06/02/2020   HGB 12.2 06/02/2020   HCT 35.6 (L) 06/02/2020   MCV 92.2 06/02/2020   PLT 222 06/02/2020   P/C: 0.10 CMP wnl  Assessment / Plan: IOL for GHTN with severe features. G1 at [redacted]w[redacted]d  Labor: s/p 3 doses cytotec, FB placed and cytotec repeated now.  Preeclampsia:  on magnesium sulfate, no signs or symptoms of toxicity, intake and ouput balanced and labs stable; d/w Dr [redacted]w[redacted]d.   Fetal Wellbeing:  Category I Pain Control:  Labor support without medications I/D:  n/a  Feliberto Gottron, CNM 06/02/2020, 8:14 PM

## 2020-06-02 NOTE — Progress Notes (Signed)
Labor Progress Note  Alexandra [name] is a 39 y.o. G3P0020 at [redacted]w[redacted]d by embryo transfer admitted for induction of labor due to Hypertension with severe features on arrival.  Subjective:  Denies HA, VD or RUQ pain. Feeling some intermittent tightness and pressure, but no pain.  - has not been severe range since admission and IV Labetalol given x 1.   Objective: BP (!) 155/105 (BP Location: Left Arm)   Pulse 83   Temp 98.5 F (36.9 C) (Oral)   Resp 16   Ht 5\' 5"  (1.651 m)   Wt 98.9 kg Comment: 218lbs  LMP 09/17/2019   SpO2 100%   BMI 36.28 kg/m  Notable VS details: reviewed, BPs remain 140-150/90-105.   General: no acute distress.  HEENT: normocephalic, atraumatic Heart: regular rate & rhythm.  No murmurs/rubs/gallops Lungs: clear to auscultation bilaterally, normal respiratory effort Abdomen: soft, gravid, non-tender Extremities: non-tender, symmetric, 2+ LE edema bilaterally.  DTRs: 2+  Neurologic: Alert & oriented x 3.  Fetal Assessment: FHT:  FHR: 140 bpm, variability: moderate,  accelerations:  Present,  decelerations:  Absent  Category/reactivity:  Category I UC:   irregular, every 1-5 minutes, palp mild SVE:   Closed/50/-2, medium/midposition.   Membrane status: intact Amniotic color: n/a   Intake/Output Summary (Last 24 hours) at 06/02/2020 0921 Last data filed at 06/02/2020 0832 Gross per 24 hour  Intake 939.44 ml  Output 1525 ml  Net -585.56 ml    Labs: Lab Results  Component Value Date   WBC 10.1 06/02/2020   HGB 12.2 06/02/2020   HCT 35.6 (L) 06/02/2020   MCV 92.2 06/02/2020   PLT 222 06/02/2020   P/C: 0.10 CMP wnl  Assessment / Plan: IOL for GHTN with severe features. G1 at [redacted]w[redacted]d  Labor: s/p cytotec, 2nd dose now. will consider foley balloon when cx more favorable.  Preeclampsia:  on magnesium sulfate, no signs or symptoms of toxicity, intake and ouput balanced and labs stable; d/w Dr [redacted]w[redacted]d.  Fetal Wellbeing:  Category I Pain Control:   Labor support without medications I/D:  n/a  Feliberto Gottron, CNM 06/02/2020, 9:13 AM

## 2020-06-03 ENCOUNTER — Inpatient Hospital Stay: Payer: Commercial Managed Care - PPO | Admitting: Anesthesiology

## 2020-06-03 ENCOUNTER — Encounter: Payer: Self-pay | Admitting: Obstetrics and Gynecology

## 2020-06-03 ENCOUNTER — Encounter
Admission: AD | Disposition: A | Discharge status: Home / Self Care | Payer: Self-pay | Source: Home / Self Care | Attending: Certified Nurse Midwife

## 2020-06-03 DIAGNOSIS — Z9889 Other specified postprocedural states: Secondary | ICD-10-CM

## 2020-06-03 LAB — CBC WITH DIFFERENTIAL/PLATELET
Abs Immature Granulocytes: 0.05 10*3/uL (ref 0.00–0.07)
Basophils Absolute: 0 10*3/uL (ref 0.0–0.1)
Basophils Relative: 0 %
Eosinophils Absolute: 0 10*3/uL (ref 0.0–0.5)
Eosinophils Relative: 0 %
HCT: 37.5 % (ref 36.0–46.0)
Hemoglobin: 13 g/dL (ref 12.0–15.0)
Immature Granulocytes: 0 %
Lymphocytes Relative: 10 %
Lymphs Abs: 1.3 10*3/uL (ref 0.7–4.0)
MCH: 31.6 pg (ref 26.0–34.0)
MCHC: 34.7 g/dL (ref 30.0–36.0)
MCV: 91 fL (ref 80.0–100.0)
Monocytes Absolute: 0.8 10*3/uL (ref 0.1–1.0)
Monocytes Relative: 6 %
Neutro Abs: 11.1 10*3/uL — ABNORMAL HIGH (ref 1.7–7.7)
Neutrophils Relative %: 84 %
Platelets: 249 10*3/uL (ref 150–400)
RBC: 4.12 MIL/uL (ref 3.87–5.11)
RDW: 12.4 % (ref 11.5–15.5)
WBC: 13.2 10*3/uL — ABNORMAL HIGH (ref 4.0–10.5)
nRBC: 0 % (ref 0.0–0.2)

## 2020-06-03 LAB — COMPREHENSIVE METABOLIC PANEL
ALT: 14 U/L (ref 0–44)
AST: 32 U/L (ref 15–41)
Albumin: 3.1 g/dL — ABNORMAL LOW (ref 3.5–5.0)
Alkaline Phosphatase: 154 U/L — ABNORMAL HIGH (ref 38–126)
Anion gap: 6 (ref 5–15)
BUN: 7 mg/dL (ref 6–20)
CO2: 22 mmol/L (ref 22–32)
Calcium: 7.6 mg/dL — ABNORMAL LOW (ref 8.9–10.3)
Chloride: 102 mmol/L (ref 98–111)
Creatinine, Ser: 0.8 mg/dL (ref 0.44–1.00)
GFR calc Af Amer: >60 mL/min (ref >60)
GFR calc non Af Amer: >60 mL/min (ref >60)
Glucose, Bld: 129 mg/dL — ABNORMAL HIGH (ref 70–99)
Potassium: 4.8 mmol/L (ref 3.5–5.1)
Sodium: 130 mmol/L — ABNORMAL LOW (ref 135–145)
Total Bilirubin: 0.8 mg/dL (ref 0.3–1.2)
Total Protein: 6.2 g/dL — ABNORMAL LOW (ref 6.5–8.1)

## 2020-06-03 SURGERY — Surgical Case
Anesthesia: Epidural

## 2020-06-03 MED ORDER — MAGNESIUM SULFATE 40 GM/1000ML IV SOLN
2.0000 g/h | INTRAVENOUS | Status: DC
  Administered 2020-06-04: 2 g/h via INTRAVENOUS
  Filled 2020-06-03: qty 1000

## 2020-06-03 MED ORDER — KETOROLAC TROMETHAMINE 30 MG/ML IJ SOLN
30.0000 mg | Freq: Four times a day (QID) | INTRAMUSCULAR | Status: AC
  Administered 2020-06-03 – 2020-06-04 (×4): 30 mg via INTRAVENOUS
  Filled 2020-06-03 (×4): qty 1

## 2020-06-03 MED ORDER — EPHEDRINE 5 MG/ML INJ
INTRAVENOUS | Status: AC
  Filled 2020-06-03: qty 4

## 2020-06-03 MED ORDER — SENNOSIDES-DOCUSATE SODIUM 8.6-50 MG PO TABS: 2.0000 | ORAL_TABLET | ORAL | Status: DC

## 2020-06-03 MED ORDER — LABETALOL HCL 100 MG PO TABS
200.0000 mg | ORAL_TABLET | Freq: Two times a day (BID) | ORAL | Status: DC
  Administered 2020-06-03: 200 mg via ORAL
  Filled 2020-06-03: qty 2

## 2020-06-03 MED ORDER — BUPIVACAINE HCL (PF) 0.5 % IJ SOLN
30.0000 mL | Freq: Once | INTRAMUSCULAR | Status: DC
  Filled 2020-06-03: qty 30

## 2020-06-03 MED ORDER — BUPIVACAINE HCL (PF) 0.5 % IJ SOLN
INTRAMUSCULAR | Status: DC | PRN
  Administered 2020-06-03: 30 mL

## 2020-06-03 MED ORDER — ONDANSETRON HCL 4 MG/2ML IJ SOLN
INTRAMUSCULAR | Status: DC | PRN
  Administered 2020-06-03: 4 mg via INTRAVENOUS

## 2020-06-03 MED ORDER — CEFAZOLIN SODIUM-DEXTROSE 2-4 GM/100ML-% IV SOLN
2.0000 g | Freq: Two times a day (BID) | INTRAVENOUS | Status: AC
  Administered 2020-06-03 – 2020-06-04 (×2): 2 g via INTRAVENOUS
  Filled 2020-06-03 (×2): qty 100

## 2020-06-03 MED ORDER — CEFAZOLIN SODIUM-DEXTROSE 2-4 GM/100ML-% IV SOLN
2.0000 g | Freq: Once | INTRAVENOUS | Status: AC
  Administered 2020-06-03: 2 g via INTRAVENOUS
  Filled 2020-06-03: qty 100

## 2020-06-03 MED ORDER — DIPHENHYDRAMINE HCL 25 MG PO CAPS: 25.0000 mg | ORAL_CAPSULE | Freq: Four times a day (QID) | ORAL | Status: DC | PRN

## 2020-06-03 MED ORDER — BUPIVACAINE HCL (PF) 0.25 % IJ SOLN
INTRAMUSCULAR | Status: DC | PRN
  Administered 2020-06-03: 7 mL via EPIDURAL

## 2020-06-03 MED ORDER — FENTANYL CITRATE (PF) 100 MCG/2ML IJ SOLN
INTRAMUSCULAR | Status: AC
  Filled 2020-06-03: qty 2

## 2020-06-03 MED ORDER — KETOROLAC TROMETHAMINE 15 MG/ML IJ SOLN
15.0000 mg | Freq: Three times a day (TID) | INTRAMUSCULAR | Status: DC | PRN
  Filled 2020-06-03: qty 1

## 2020-06-03 MED ORDER — PHENYLEPHRINE 40 MCG/ML (10ML) SYRINGE FOR IV PUSH (FOR BLOOD PRESSURE SUPPORT): 80.0000 ug | PREFILLED_SYRINGE | INTRAVENOUS | Status: DC | PRN

## 2020-06-03 MED ORDER — DIPHENHYDRAMINE HCL 50 MG/ML IJ SOLN: 12.5000 mg | INTRAMUSCULAR | Status: DC | PRN

## 2020-06-03 MED ORDER — SIMETHICONE 80 MG PO CHEW: 80.0000 mg | CHEWABLE_TABLET | ORAL | Status: DC

## 2020-06-03 MED ORDER — SODIUM CHLORIDE 0.9% FLUSH
50.0000 mL | Freq: Once | INTRAVENOUS | Status: DC
  Filled 2020-06-03: qty 51

## 2020-06-03 MED ORDER — LACTATED RINGERS IV SOLN
500.0000 mL | Freq: Once | INTRAVENOUS | Status: AC
  Administered 2020-06-03: 500 mL via INTRAVENOUS

## 2020-06-03 MED ORDER — SODIUM CHLORIDE 0.9 % IV SOLN
INTRAVENOUS | Status: DC | PRN
  Administered 2020-06-03: 50 mL

## 2020-06-03 MED ORDER — EPHEDRINE 5 MG/ML INJ: 10.0000 mg | INTRAVENOUS | Status: DC | PRN

## 2020-06-03 MED ORDER — COCONUT OIL OIL
1.0000 "application " | TOPICAL_OIL | Status: DC | PRN
  Administered 2020-06-06: 1 via TOPICAL
  Filled 2020-06-03: qty 120

## 2020-06-03 MED ORDER — MORPHINE SULFATE (PF) 0.5 MG/ML IJ SOLN
INTRAMUSCULAR | Status: DC | PRN
  Administered 2020-06-03: 100 ug via EPIDURAL

## 2020-06-03 MED ORDER — FENTANYL 2.5 MCG/ML W/ROPIVACAINE 0.15% IN NS 100 ML EPIDURAL (ARMC)
EPIDURAL | Status: AC
  Administered 2020-06-03: 12 mL/h via EPIDURAL
  Filled 2020-06-03: qty 100

## 2020-06-03 MED ORDER — ONDANSETRON HCL 4 MG/2ML IJ SOLN: 4.0000 mg | Freq: Once | INTRAMUSCULAR | Status: DC | PRN

## 2020-06-03 MED ORDER — LIDOCAINE-EPINEPHRINE (PF) 1.5 %-1:200000 IJ SOLN
INTRAMUSCULAR | Status: DC | PRN
  Administered 2020-06-03 (×2): 3 mL via PERINEURAL

## 2020-06-03 MED ORDER — OXYTOCIN-SODIUM CHLORIDE 30-0.9 UT/500ML-% IV SOLN
INTRAVENOUS | Status: DC | PRN
  Administered 2020-06-03: 450 mL/h via INTRAVENOUS

## 2020-06-03 MED ORDER — BUPIVACAINE LIPOSOME 1.3 % IJ SUSP
20.0000 mL | Freq: Once | INTRAMUSCULAR | Status: DC
  Filled 2020-06-03: qty 20

## 2020-06-03 MED ORDER — MENTHOL 3 MG MT LOZG
1.0000 | LOZENGE | OROMUCOSAL | Status: DC | PRN
  Filled 2020-06-03: qty 9

## 2020-06-03 MED ORDER — ROPIVACAINE HCL 2 MG/ML IJ SOLN
INTRAMUSCULAR | Status: AC
  Filled 2020-06-03: qty 100

## 2020-06-03 MED ORDER — SIMETHICONE 80 MG PO CHEW: 80.0000 mg | CHEWABLE_TABLET | ORAL | Status: DC | PRN

## 2020-06-03 MED ORDER — ROPIVACAINE HCL 2 MG/ML IJ SOLN: 10.0000 mL/h | INTRAMUSCULAR | Status: DC

## 2020-06-03 MED ORDER — PRENATAL MULTIVITAMIN CH
1.0000 | ORAL_TABLET | Freq: Every day | ORAL | Status: DC
  Administered 2020-06-04 – 2020-06-06 (×3): 1 via ORAL
  Filled 2020-06-03 (×3): qty 1

## 2020-06-03 MED ORDER — SIMETHICONE 80 MG PO CHEW
80.0000 mg | CHEWABLE_TABLET | Freq: Three times a day (TID) | ORAL | Status: DC
  Administered 2020-06-03 – 2020-06-06 (×8): 80 mg via ORAL
  Filled 2020-06-03 (×6): qty 1

## 2020-06-03 MED ORDER — DIBUCAINE (PERIANAL) 1 % EX OINT: 1.0000 "application " | TOPICAL_OINTMENT | CUTANEOUS | Status: DC | PRN

## 2020-06-03 MED ORDER — LACTATED RINGERS IV SOLN
INTRAVENOUS | Status: DC
  Administered 2020-06-04: 250 mL via INTRAVENOUS

## 2020-06-03 MED ORDER — SODIUM CHLORIDE 0.9 % IV SOLN
INTRAVENOUS | Status: DC | PRN
  Administered 2020-06-03: 1.6 mL via EPIDURAL

## 2020-06-03 MED ORDER — FENTANYL 2.5 MCG/ML W/ROPIVACAINE 0.15% IN NS 100 ML EPIDURAL (ARMC)
12.0000 mL/h | EPIDURAL | Status: DC
  Administered 2020-06-03: 12 mL/h via EPIDURAL
  Filled 2020-06-03 (×2): qty 100

## 2020-06-03 MED ORDER — GABAPENTIN 300 MG PO CAPS
300.0000 mg | ORAL_CAPSULE | Freq: Every day | ORAL | Status: DC
  Administered 2020-06-03 – 2020-06-05 (×3): 300 mg via ORAL
  Filled 2020-06-03 (×3): qty 1

## 2020-06-03 MED ORDER — ZOLPIDEM TARTRATE 5 MG PO TABS: 5.0000 mg | ORAL_TABLET | Freq: Every evening | ORAL | Status: DC | PRN

## 2020-06-03 MED ORDER — NALBUPHINE HCL 10 MG/ML IJ SOLN: 2.5000 mg | Freq: Four times a day (QID) | INTRAMUSCULAR | Status: DC | PRN

## 2020-06-03 MED ORDER — MORPHINE SULFATE (PF) 0.5 MG/ML IJ SOLN
INTRAMUSCULAR | Status: AC
  Filled 2020-06-03: qty 10

## 2020-06-03 MED ORDER — MORPHINE SULFATE (PF) 2 MG/ML IV SOLN: 1.0000 mg | INTRAVENOUS | Status: DC | PRN

## 2020-06-03 MED ORDER — TETANUS-DIPHTH-ACELL PERTUSSIS 5-2.5-18.5 LF-MCG/0.5 IM SUSP
0.5000 mL | Freq: Once | INTRAMUSCULAR | Status: DC
  Filled 2020-06-03: qty 0.5

## 2020-06-03 MED ORDER — ACETAMINOPHEN 500 MG PO TABS
1000.0000 mg | ORAL_TABLET | Freq: Four times a day (QID) | ORAL | Status: DC
  Administered 2020-06-03 – 2020-06-04 (×5): 1000 mg via ORAL
  Filled 2020-06-03 (×5): qty 2

## 2020-06-03 MED ORDER — LIDOCAINE HCL (PF) 1 % IJ SOLN
INTRAMUSCULAR | Status: DC | PRN
  Administered 2020-06-03: 3 mL

## 2020-06-03 MED ORDER — FENTANYL CITRATE (PF) 100 MCG/2ML IJ SOLN
INTRAMUSCULAR | Status: DC | PRN
  Administered 2020-06-03: 50 ug via INTRAVENOUS
  Administered 2020-06-03: 15 ug via INTRAVENOUS
  Administered 2020-06-03: 50 ug via INTRAVENOUS

## 2020-06-03 MED ORDER — IBUPROFEN 800 MG PO TABS
800.0000 mg | ORAL_TABLET | Freq: Four times a day (QID) | ORAL | Status: DC
  Administered 2020-06-04: 800 mg via ORAL
  Filled 2020-06-03: qty 1

## 2020-06-03 MED ORDER — OXYTOCIN-SODIUM CHLORIDE 30-0.9 UT/500ML-% IV SOLN
2.5000 [IU]/h | INTRAVENOUS | Status: DC
  Administered 2020-06-03 – 2020-06-04 (×2): 2.5 [IU]/h via INTRAVENOUS
  Filled 2020-06-03: qty 500

## 2020-06-03 MED ORDER — WITCH HAZEL-GLYCERIN EX PADS: 1.0000 "application " | MEDICATED_PAD | CUTANEOUS | Status: DC | PRN

## 2020-06-03 MED ORDER — CARBOPROST TROMETHAMINE 250 MCG/ML IM SOLN
INTRAMUSCULAR | Status: AC
  Filled 2020-06-03: qty 1

## 2020-06-03 MED ORDER — LEVOTHYROXINE SODIUM 100 MCG PO TABS
100.0000 ug | ORAL_TABLET | Freq: Every day | ORAL | Status: DC
  Administered 2020-06-04 – 2020-06-06 (×3): 100 ug via ORAL
  Filled 2020-06-03 (×3): qty 1

## 2020-06-03 MED ORDER — OXYCODONE HCL 5 MG PO TABS: 5.0000 mg | ORAL_TABLET | ORAL | Status: DC | PRN

## 2020-06-03 MED ORDER — FENTANYL CITRATE (PF) 100 MCG/2ML IJ SOLN: 25.0000 ug | INTRAMUSCULAR | Status: DC | PRN

## 2020-06-03 MED ORDER — BUPIVACAINE HCL (PF) 0.25 % IJ SOLN
INTRAMUSCULAR | Status: AC
  Filled 2020-06-03: qty 30

## 2020-06-03 MED ORDER — LABETALOL HCL 5 MG/ML IV SOLN
INTRAVENOUS | Status: AC
  Filled 2020-06-03: qty 4

## 2020-06-03 MED ORDER — SODIUM CHLORIDE 0.9 % IV SOLN
500.0000 mg | Freq: Once | INTRAVENOUS | Status: AC
  Administered 2020-06-03: 500 mg via INTRAVENOUS
  Filled 2020-06-03: qty 500

## 2020-06-03 MED ORDER — SODIUM CHLORIDE (PF) 0.9 % IJ SOLN
INTRAMUSCULAR | Status: AC
  Filled 2020-06-03: qty 50

## 2020-06-03 SURGICAL SUPPLY — 25 items
BARRIER ADHS 3X4 INTERCEED (GAUZE/BANDAGES/DRESSINGS) ×3 IMPLANT
CANISTER SUCT 3000ML PPV (MISCELLANEOUS) ×3 IMPLANT
CHLORAPREP W/TINT 26 (MISCELLANEOUS) ×3 IMPLANT
COVER WAND RF STERILE (DRAPES) ×3 IMPLANT
DRSG TELFA 3X8 NADH (GAUZE/BANDAGES/DRESSINGS) ×3 IMPLANT
ELECT CAUTERY BLADE 6.4 (BLADE) ×3 IMPLANT
ELECT REM PT RETURN 9FT ADLT (ELECTROSURGICAL) ×3
ELECTRODE REM PT RTRN 9FT ADLT (ELECTROSURGICAL) ×1 IMPLANT
GAUZE SPONGE 4X4 12PLY STRL (GAUZE/BANDAGES/DRESSINGS) ×3 IMPLANT
GLOVE SURG SYN 8.0 (GLOVE) ×3 IMPLANT
GOWN STRL REUS W/ TWL LRG LVL3 (GOWN DISPOSABLE) ×2 IMPLANT
GOWN STRL REUS W/ TWL XL LVL3 (GOWN DISPOSABLE) ×1 IMPLANT
GOWN STRL REUS W/TWL LRG LVL3 (GOWN DISPOSABLE) ×4
GOWN STRL REUS W/TWL XL LVL3 (GOWN DISPOSABLE) ×2
NEEDLE HYPO 22GX1.5 SAFETY (NEEDLE) ×3 IMPLANT
NS IRRIG 1000ML POUR BTL (IV SOLUTION) ×3 IMPLANT
PACK C SECTION (MISCELLANEOUS) ×3 IMPLANT
PAD OB MATERNITY 4.3X12.25 (PERSONAL CARE ITEMS) ×3 IMPLANT
PAD PREP 24X41 OB/GYN DISP (PERSONAL CARE ITEMS) ×3 IMPLANT
STAPLER INSORB 30 2030 C-SECTI (MISCELLANEOUS) ×3 IMPLANT
STRAP SAFETY 5IN WIDE (MISCELLANEOUS) ×3 IMPLANT
SUT CHROMIC 1 CTX 36 (SUTURE) ×9 IMPLANT
SUT PLAIN GUT 0 (SUTURE) ×6 IMPLANT
SUT VIC AB 0 CT1 36 (SUTURE) ×6 IMPLANT
SYR 30ML LL (SYRINGE) ×6 IMPLANT

## 2020-06-03 NOTE — Progress Notes (Signed)
Labor Progress Note  Alexandra [name] is a 39 y.o. G3P0020 at [redacted]w[redacted]d by embryo transfer admitted for induction of labor due to Hypertension with severe features on arrival.  Subjective:  Denies HA, VD or RUQ pain. Comfortable with epidural.   Objective: BP (!) 143/67   Pulse 94   Temp 98 F (36.7 C) (Oral)   Resp 18   Ht 5\' 5"  (1.651 m)   Wt 98.9 kg Comment: 218lbs  LMP 09/17/2019   SpO2 99%   BMI 36.28 kg/m  Notable VS details: reviewed- elevated BPs intermittently since epidural placed.   General: no acute distress.  HEENT: normocephalic, atraumatic Heart: regular rate & rhythm.  No murmurs/rubs/gallops Lungs: clear to auscultation bilaterally, normal respiratory effort Abdomen: soft, gravid, non-tender Extremities: non-tender, symmetric, 2+ LE edema bilaterally-TED hose in place.  DTRs: 2+  Neurologic: Alert & oriented x 3.  Fetal Assessment: FHT:  FHR: 140 bpm, variability: moderate,  accelerations:  Present,  decelerations:  Absent  Category/reactivity:  Category I UC:  Not tracing well, Pitocin at 70mu/min SVE: 5/75/-1 with BBOW, AROM performed, copious amt of clear fluid. IUPC placed for adequate UC monitoring Membrane status: AROM at 0748 Amniotic color: clear   Intake/Output Summary (Last 24 hours) at 06/03/2020 0802 Last data filed at 06/03/2020 0700 Gross per 24 hour  Intake 3091.5 ml  Output 3750 ml  Net -658.5 ml    Labs: Lab Results  Component Value Date   WBC 13.2 (H) 06/02/2020   HGB 13.0 06/02/2020   HCT 37.5 06/02/2020   MCV 91.0 06/02/2020   PLT 249 06/02/2020   P/C: 0.10 CMP wnl  Assessment / Plan: IOL for GHTN with severe features. G1 at [redacted]w[redacted]d  Labor: s/p 4 doses cytotec, foley balloon and pitocin. AROM now. Fetal head well applied.  Preeclampsia:  on magnesium sulfate, no signs or symptoms of toxicity, intake and ouput balanced and labs stable; total fluids changed to 160ml/hr, Na noted at 130 at midnight last night.  Fetal Wellbeing:   Category I Pain Control:  Epidural I/D:  n/a  32m, CNM 06/03/2020, 8:02 AM

## 2020-06-03 NOTE — Progress Notes (Signed)
Labor Progress Note  Alexandra [name] is a 39 y.o. G3P0020 at [redacted]w[redacted]d by embryo transfer admitted for induction of labor due to Hypertension with severe features on arrival.  Subjective:  Denies HA, VD or RUQ pain. Crying in pain with UCs and requesting c-section.   Objective: BP (!) 159/97   Pulse (!) 102   Temp 99 F (37.2 C) (Axillary)   Resp 16   Ht 5\' 5"  (1.651 m)   Wt 98.9 kg Comment: 218lbs  LMP 09/17/2019   SpO2 97%   BMI 36.28 kg/m  Notable VS details: 140-150/80-90s  General: no acute distress.  HEENT: normocephalic, atraumatic Heart: regular rate & rhythm.  No murmurs/rubs/gallops Lungs: clear to auscultation bilaterally, normal respiratory effort Abdomen: soft, gravid, non-tender Extremities: non-tender, symmetric, 2+ LE edema bilaterally-TED hose in place.  DTRs: 2+  Neurologic: Alert & oriented x 3.  Fetal Assessment: FHT:  FHR: 135 bpm, variability: minimal ,  accelerations:  Present,  decelerations:  Absent   Category/reactivity:  Category I with periods of minimal variability.  UC:  IUPC in place,  Pitocin at 40mu/min, MVUs remain inadequate at this time, 16m for last period. UCs q3-30min  SVE: 5/80/-1 unchanged.  Caput noted.  Membrane status: AROM at 0748 Amniotic color: clear   Intake/Output Summary (Last 24 hours) at 06/03/2020 1642 Last data filed at 06/03/2020 1625 Gross per 24 hour  Intake 3565.5 ml  Output 5900 ml  Net -2334.5 ml    Labs: Lab Results  Component Value Date   WBC 13.2 (H) 06/02/2020   HGB 13.0 06/02/2020   HCT 37.5 06/02/2020   MCV 91.0 06/02/2020   PLT 249 06/02/2020   P/C: 0.10 CMP wnl  Assessment / Plan: IOL for GHTN with severe features. G1 at [redacted]w[redacted]d  Labor: s/p 4 doses cytotec, foley balloon and pitocin. AROM and IUPC. Fetal head well applied but no cervical change since approx 0300. - discussed with Dr [redacted]w[redacted]d, will proceed to CS.  Preeclampsia:  on magnesium sulfate, no signs or symptoms of toxicity,  intake and ouput balanced and labs stable;  Fetal Wellbeing:  Category I Pain Control:  Epidural I/D:  Ancef 2gm IVPB, Azithromycin 500mg  IVPB ordered.   Feliberto Gottron, CNM 06/03/2020, 4:42 PM

## 2020-06-03 NOTE — Transfer of Care (Signed)
Immediate Anesthesia Transfer of Care Note  Patient: Alexandra [name]  Procedure(s) Performed: CESAREAN SECTION (N/A )  Patient Location: PACU  Anesthesia Type:Spinal  Level of Consciousness: awake, alert  and oriented  Airway & Oxygen Therapy: Patient Spontanous Breathing and Patient connected to face mask oxygen  Post-op Assessment: Report given to RN and Post -op Vital signs reviewed and stable  Post vital signs: Reviewed and stable  Last Vitals:  Vitals Value Taken Time  BP    Temp    Pulse    Resp    SpO2      Last Pain:  Vitals:   06/03/20 1527  TempSrc:   PainSc: 7       Patients Stated Pain Goal: 0 (06/02/20 2027)  Complications: No complications documented.

## 2020-06-03 NOTE — Anesthesia Procedure Notes (Signed)
Spinal  Patient location during procedure: OR Start time: 06/03/2020 5:30 PM End time: 06/03/2020 5:35 PM Staffing Performed: resident/CRNA  Anesthesiologist: Yves Dill, MD Resident/CRNA: Almeta Monas, CRNA Preanesthetic Checklist Completed: patient identified, IV checked, site marked, risks and benefits discussed, surgical consent, monitors and equipment checked, pre-op evaluation and timeout performed Spinal Block Patient position: sitting Prep: DuraPrep Patient monitoring: heart rate, cardiac monitor, continuous pulse ox and blood pressure Approach: midline Location: L3-4 Injection technique: single-shot Needle Needle type: Sprotte  Needle gauge: 24 G Needle length: 9 cm Assessment Sensory level: T4

## 2020-06-03 NOTE — Progress Notes (Signed)
Labor Progress Note  Alexandra [name] is a 39 y.o. G3P0020 at [redacted]w[redacted]d by embryo transfer admitted for induction of labor due to Hypertension with severe features on arrival.  Subjective:  Denies HA, VD or RUQ pain. Epidural replaced recently due to new onset discomfort.   Objective: BP (!) 148/90   Pulse 94   Temp 98.3 F (36.8 C) (Oral)   Resp 18   Ht 5\' 5"  (1.651 m)   Wt 98.9 kg Comment: 218lbs  LMP 09/17/2019   SpO2 100%   BMI 36.28 kg/m  Notable VS details: 130-140/80-90s  General: no acute distress.  HEENT: normocephalic, atraumatic Heart: regular rate & rhythm.  No murmurs/rubs/gallops Lungs: clear to auscultation bilaterally, normal respiratory effort Abdomen: soft, gravid, non-tender Extremities: non-tender, symmetric, 2+ LE edema bilaterally-TED hose in place.  DTRs: 2+  Neurologic: Alert & oriented x 3.  Fetal Assessment: FHT:  FHR: 135 bpm, variability: minimal ,  accelerations:  Abscent,  decelerations:  Absent  Category/reactivity:  Category I with periods of minimal variability.  UC:  IUPC in place,  Pitocin at 81mu/min, MVUs inadequate at this time, 4m for last period. UCs q4-79min, Pitocin was cut in half during epidural replacement.  SVE: last exam per RN, unchanged.   Membrane status: AROM at 0748 Amniotic color: clear   Intake/Output Summary (Last 24 hours) at 06/03/2020 1352 Last data filed at 06/03/2020 1300 Gross per 24 hour  Intake 2820.5 ml  Output 6000 ml  Net -3179.5 ml    Labs: Lab Results  Component Value Date   WBC 13.2 (H) 06/02/2020   HGB 13.0 06/02/2020   HCT 37.5 06/02/2020   MCV 91.0 06/02/2020   PLT 249 06/02/2020   P/C: 0.10 CMP wnl  Assessment / Plan: IOL for GHTN with severe features. G1 at [redacted]w[redacted]d  Labor: s/p 4 doses cytotec, foley balloon and pitocin. AROM and IUPC. Fetal head well applied.  Preeclampsia:  on magnesium sulfate, no signs or symptoms of toxicity, intake and ouput balanced and labs stable;  Fetal  Wellbeing:  Category I Pain Control:  Epidural I/D:  n/a  [redacted]w[redacted]d, CNM 06/03/2020, 1:52 PM

## 2020-06-03 NOTE — Progress Notes (Signed)
Patient ID: Alexandra [name], female   DOB: July 21, 1981, 39 y.o.   MRN: [id number] Pt has not progressed past 5 cm for 13 hours IUPC on Pit at 18 mu/ min . MVU 180 . CLE and replaced still without adequate pain relief . Pt has elected for Primary LTCS .  A: Active phase arrest and uncontrolled pain from failed CLE x 2   P: Proceed with cesarean section . Pt has been counseled for risks including organ injury , Blood transfusion and infectious disease risks , infection , DVT , and potential injury to infant . All questions answered . Proceed .

## 2020-06-03 NOTE — Discharge Summary (Signed)
Obstetrical Discharge Summary  Patient Name: Alexandra [name] DOB: August 24, 1981 MRN: [id number]  Date of Admission: 06/02/2020 Date of Delivery: 06/03/2020 Delivered by: Huel Cote MD Date of Discharge: 06/06/20 Primary OB: [facility]  [id number] last menstrual period was 09/17/2019. EDC Estimated Date of Delivery: 06/23/20 Gestational Age at Delivery: [redacted]w[redacted]d   Antepartum complications:Active phase arrest, failed CLEx2, patient elects for cesarean section  Admitting Diagnosis: Gestational HTN  Secondary Diagnosis: Patient Active Problem List   Diagnosis Date Noted  . Postoperative state 06/03/2020  . Gestational hypertension affecting first pregnancy 06/02/2020  . Elevated BP without diagnosis of hypertension 05/22/2020    Augmentation: AROM, Pitocin and Cytotec Complications: None Intrapartum complications/course: elevated BP , started on MAgnesium for seizure prophylaxis . Pt didn't progress past 5 cm  With adequate ctx pattern , Failed CLEx2 and pt requested cesarean  section  Date of Delivery: 06/03/2020 Delivered By: Huel Cote MD Delivery Type: primary cesarean section, low transverse incision Anesthesia: Spinal Placenta: manual Laceration:  Episiotomy: none Newborn Data: Female  Apgars 4/8 weight #5/15 Live born adult  Birth Weight:   APGAR: ,   Newborn Delivery   Birth date/time:  Delivery type:         Postpartum Procedures: none  Edinburgh:  Edinburgh Postnatal Depression Scale Screening Tool 06/05/2020  I have been able to laugh and see the funny side of things. 0  I have looked forward with enjoyment to things. 0  I have blamed myself unnecessarily when things went wrong. 1  I have been anxious or worried for no good reason. 1  I have felt scared or panicky for no good reason. 1  Things have been getting on top of me. 1  I have been so unhappy that I have had difficulty sleeping. 0  I have felt sad or miserable. 0  I have been so  unhappy that I have been crying. 0  The thought of harming myself has occurred to me. 0  Edinburgh Postnatal Depression Scale Total 4      (Cesarean Section):  Patient had an uncomplicated postpartum course.  By time of discharge on POD#3, her pain was controlled on oral pain medications; she had appropriate lochia and was ambulating, voiding without difficulty, tolerating regular diet and passing flatus. BP remained in control on Labetalol 200mg  PO BID.  She was deemed stable for discharge to home.    Discharge Physical Exam:  BP 134/86 (BP Location: Left Arm)   Pulse 72   Temp 98.6 F (37 C) (Oral)   Resp 18   Ht 5\' 5"  (1.651 m)   Wt 98.9 kg Comment: 218lbs  LMP 09/17/2019   SpO2 99%   Breastfeeding Unknown   BMI 36.28 kg/m   General: NAD CV: RRR Pulm: CTABL, nl effort ABD: s/nd/nt, fundus firm and below the umbilicus Lochia: moderate Incision: c/d/i DVT Evaluation: LE non-ttp, no evidence of DVT on exam.  Hemoglobin  Date Value Ref Range Status  06/04/2020 10.9 (L) 12.0 - 15.0 g/dL Final   HCT  Date Value Ref Range Status  06/04/2020 30.5 (L) 36 - 46 % Final     Disposition: stable, discharge to home. Baby Feeding: breastmilk Baby Disposition: home with mom  Rh Immune globulin given: n/a Rubella vaccine given: n/a  Tdap vaccine given in AP or PP setting: 04/18/20 Flu vaccine given in AP or PP setting: declined  Contraception: S/P bilateral salpingectomy  And this preg was IVF   Prenatal Labs:   Blood type/Rh  O pos  Antibody screen neg  Rubella Immune  Varicella Immune  RPR NR  HBsAg Neg  HIV NR  GC neg  Chlamydia neg  Genetic screening negative  1 hour GTT  153  3 hour GTT  [phone]  GBS  neg      Plan:  Alexandra [name] was discharged to home in good condition. Follow-up appointment with delivering provider in 6 weeks.  Discharge Medications: Allergies as of 06/06/2020   No Known Allergies     Medication List    STOP taking these  medications   aspirin 81 MG chewable tablet     TAKE these medications   acetaminophen 500 MG tablet Commonly known as: TYLENOL Take 2 tablets (1,000 mg total) by mouth every 6 (six) hours.   ibuprofen 800 MG tablet Commonly known as: ADVIL Take 1 tablet (800 mg total) by mouth every 6 (six) hours.   labetalol 200 MG tablet Commonly known as: NORMODYNE Take 1 tablet (200 mg total) by mouth 2 (two) times daily. Hold dose for BP less than 100/60   levothyroxine 100 MCG tablet Commonly known as: SYNTHROID Take 100 mcg by mouth daily before breakfast.   oxyCODONE 5 MG immediate release tablet Commonly known as: Oxy IR/ROXICODONE Take 1 tablet (5 mg total) by mouth every 6 (six) hours as needed for up to 7 days for moderate pain.   prenatal multivitamin Tabs tablet Take 1 tablet by mouth daily at 12 noon.   senna-docusate 8.6-50 MG tablet Commonly known as: Senokot-S Take 2 tablets by mouth daily.        Follow-up Information    Schermerhorn, Ihor Austin, MD Follow up in 2 week(s).   Specialty: Obstetrics and Gynecology Why: post-op appt with Dr Fredrik Cove information: [address] [place] [address] [phone]        Randa Ngo, CNM Follow up in 2 day(s).   Specialty: Obstetrics and Gynecology Why: Postpartum blood pressure check- video visit okay if pt has a BP cuff.  Contact information: [address] [place] [address] [phone]               Signed: Randa Ngo, CNM 06/06/2020 10:22 AM

## 2020-06-03 NOTE — Anesthesia Procedure Notes (Signed)
Epidural Patient location during procedure: OB Start time: 06/03/2020 4:20 AM End time: 06/03/2020 4:35 AM  Staffing Anesthesiologist: Yves Dill, MD Performed: anesthesiologist   Preanesthetic Checklist Completed: patient identified, IV checked, site marked, risks and benefits discussed, surgical consent, monitors and equipment checked, pre-op evaluation and timeout performed  Epidural Patient position: sitting Prep: Betadine Patient monitoring: heart rate, continuous pulse ox and blood pressure Approach: midline Location: L4-L5 Injection technique: LOR air  Needle:  Needle type: Tuohy  Needle gauge: 17 G Needle length: 9 cm and 9 Catheter type: closed end flexible Catheter size: 19 Gauge Test dose: negative and 1.5% lidocaine with Epi 1:200 K  Assessment Events: blood not aspirated, injection not painful, no injection resistance, no paresthesia and negative IV test  Additional Notes Time out called. Patient placed in sitting position .  Back prepped and draped in sterile fashion.  A skin wheal was made in the L4-L5 interspace with 1% Lidocaine plain.  A 17G Tuohy needle was guided into the epidural space by a loss of resistance technique.  No blood, fluid or paresthesias.  The epidural catheter was threaded 3 cm and the TD was negative.  The patient tolerated the procedure well.Reason for block:procedure for pain

## 2020-06-03 NOTE — Brief Op Note (Signed)
06/03/2020  6:13 PM  PATIENT:  Alexandra [name]  39 y.o. female  PRE-OPERATIVE DIAGNOSIS:  failure to progress, failed epidural x 2,   POST-OPERATIVE DIAGNOSIS:  failure to progress, failed epidural x 2   PROCEDURE:  Procedure(s): CESAREAN SECTION (N/A)LTCS  SURGEON:  Surgeon(s) and Role:    * Zebastian Carico, Ihor Austin, MD - Primary  PHYSICIAN ASSISTANT: Heloise Ochoa , CNM   ASSISTANTS: none   ANESTHESIA:   spinal  EBL:  QBL = , IOF 800 cc, UO 50 cc  BLOOD ADMINISTERED:none  DRAINS: Urinary Catheter (Foley)   LOCAL MEDICATIONS USED:  MARCAINE    and BUPIVICAINE   SPECIMEN:  No Specimen  DISPOSITION OF SPECIMEN:  N/A  COUNTS:  YES  TOURNIQUET:  * No tourniquets in log *  DICTATION: .Other Dictation: Dictation Number verbal   PLAN OF CARE: Admit to inpatient   PATIENT DISPOSITION:  PACU - hemodynamically stable.   Delay start of Pharmacological VTE agent (>24hrs) due to surgical blood loss or risk of bleeding: not applicable

## 2020-06-03 NOTE — Anesthesia Preprocedure Evaluation (Addendum)
Anesthesia Evaluation  Patient identified by MRN, date of birth, ID band Patient awake    Reviewed: Allergy & Precautions, NPO status , Patient's Chart, lab work & pertinent test results  Airway Mallampati: II  TM Distance: >3 FB     Dental   Pulmonary neg pulmonary ROS,    Pulmonary exam normal        Cardiovascular hypertension, Normal cardiovascular exam     Neuro/Psych negative neurological ROS  negative psych ROS   GI/Hepatic negative GI ROS, Neg liver ROS,   Endo/Other  Hypothyroidism   Renal/GU negative Renal ROS  negative genitourinary   Musculoskeletal negative musculoskeletal ROS (+)   Abdominal Normal abdominal exam  (+)   Peds negative pediatric ROS (+)  Hematology negative hematology ROS (+)   Anesthesia Other Findings Past Medical History: No date: Hypothyroidism No date: Miscarriage No date: Thyroid disease  Reproductive/Obstetrics (+) Pregnancy                             Anesthesia Physical Anesthesia Plan  ASA: II  Anesthesia Plan: Epidural   Post-op Pain Management:    Induction:   PONV Risk Score and Plan:   Airway Management Planned: Natural Airway  Additional Equipment:   Intra-op Plan:   Post-operative Plan:   Informed Consent: I have reviewed the patients History and Physical, chart, labs and discussed the procedure including the risks, benefits and alternatives for the proposed anesthesia with the patient or authorized representative who has indicated his/her understanding and acceptance.     Dental advisory given  Plan Discussed with: CRNA and Surgeon  Anesthesia Plan Comments:         Anesthesia Quick Evaluation

## 2020-06-04 ENCOUNTER — Encounter: Payer: Self-pay | Admitting: Obstetrics and Gynecology

## 2020-06-04 LAB — COMPREHENSIVE METABOLIC PANEL
ALT: 15 U/L (ref 0–44)
AST: 25 U/L (ref 15–41)
Albumin: 2.4 g/dL — ABNORMAL LOW (ref 3.5–5.0)
Alkaline Phosphatase: 122 U/L (ref 38–126)
Anion gap: 9 (ref 5–15)
BUN: 11 mg/dL (ref 6–20)
CO2: 21 mmol/L — ABNORMAL LOW (ref 22–32)
Calcium: 6.6 mg/dL — ABNORMAL LOW (ref 8.9–10.3)
Chloride: 101 mmol/L (ref 98–111)
Creatinine, Ser: 1.01 mg/dL — ABNORMAL HIGH (ref 0.44–1.00)
GFR calc Af Amer: >60 mL/min (ref >60)
GFR calc non Af Amer: >60 mL/min (ref >60)
Glucose, Bld: 116 mg/dL — ABNORMAL HIGH (ref 70–99)
Potassium: 3.9 mmol/L (ref 3.5–5.1)
Sodium: 131 mmol/L — ABNORMAL LOW (ref 135–145)
Total Bilirubin: 0.6 mg/dL (ref 0.3–1.2)
Total Protein: 5.1 g/dL — ABNORMAL LOW (ref 6.5–8.1)

## 2020-06-04 LAB — CBC
HCT: 30.5 % — ABNORMAL LOW (ref 36.0–46.0)
Hemoglobin: 10.9 g/dL — ABNORMAL LOW (ref 12.0–15.0)
MCH: 32 pg (ref 26.0–34.0)
MCHC: 35.7 g/dL (ref 30.0–36.0)
MCV: 89.4 fL (ref 80.0–100.0)
Platelets: 201 10*3/uL (ref 150–400)
RBC: 3.41 MIL/uL — ABNORMAL LOW (ref 3.87–5.11)
RDW: 12.7 % (ref 11.5–15.5)
WBC: 14.4 10*3/uL — ABNORMAL HIGH (ref 4.0–10.5)
nRBC: 0 % (ref 0.0–0.2)

## 2020-06-04 LAB — MAGNESIUM: Magnesium: 6.9 mg/dL (ref 1.7–2.4)

## 2020-06-04 MED ORDER — ACETAMINOPHEN 500 MG PO TABS
1000.0000 mg | ORAL_TABLET | Freq: Four times a day (QID) | ORAL | Status: DC
  Administered 2020-06-05 – 2020-06-06 (×6): 1000 mg via ORAL
  Filled 2020-06-04 (×7): qty 2

## 2020-06-04 MED ORDER — SENNOSIDES-DOCUSATE SODIUM 8.6-50 MG PO TABS
2.0000 | ORAL_TABLET | ORAL | Status: DC
  Administered 2020-06-05: 2 via ORAL
  Filled 2020-06-04: qty 2

## 2020-06-04 MED ORDER — LABETALOL HCL 200 MG PO TABS
200.0000 mg | ORAL_TABLET | Freq: Once | ORAL | Status: AC
  Administered 2020-06-04: 200 mg via ORAL
  Filled 2020-06-04: qty 1

## 2020-06-04 MED ORDER — SIMETHICONE 80 MG PO CHEW
80.0000 mg | CHEWABLE_TABLET | ORAL | Status: DC
  Filled 2020-06-04 (×2): qty 1

## 2020-06-04 MED ORDER — IBUPROFEN 800 MG PO TABS
800.0000 mg | ORAL_TABLET | Freq: Four times a day (QID) | ORAL | Status: DC
  Administered 2020-06-05 – 2020-06-06 (×6): 800 mg via ORAL
  Filled 2020-06-04 (×6): qty 1

## 2020-06-04 NOTE — Progress Notes (Signed)
Post Partum Day 1  Subjective: Reports dizziness when getting up. Low output - over night 40-47mL/hr   Pain managed with PO meds, tolerating regular diet.   No fever/chills, chest pain, shortness of breath, nausea/vomiting, or leg pain. No nipple or breast pain. No headache, visual changes, or RUQ/epigastric pain.  Objective: BP 110/83 (BP Location: Left Arm)   Pulse 69   Temp 98 F (36.7 C) (Oral)   Resp 15   Ht 5\' 5"  (1.651 m)   Wt 98.9 kg Comment: 218lbs  LMP 09/17/2019   SpO2 94%   BMI 36.28 kg/m  BP Range: 112-127/73-81   Physical Exam:  General: alert and cooperative Breasts: soft/nontender CV: RRR Pulm: nl effort Abdomen: soft, non-tender Uterine Fundus: firm Incision: dressing in place c/d/i Lochia: appropriate DVT Evaluation: No evidence of DVT seen on physical exam. Edinburgh: No flowsheet data found.   Recent Labs    06/02/20 2337 06/04/20 0530  HGB 13.0 10.9*  HCT 37.5 30.5*  WBC 13.2* 14.4*  PLT 249 201    Assessment/Plan: 39 y.o. G3P0020 postpartum day # 1  - Plan Mag d/c today at 75 -Continue routine postpartum care -Lactation consult PRN for breastfeeding   -Acute blood loss anemia - hemodynamically stable and asymptomatic; start PO ferrous sulfate BID with stool softeners  -Immunization status: all immunizations up to date  Disposition: Continue inpatient postpartum care    LOS: 2 days   Alexandra [name], CNM 06/04/2020, 8:35 AM

## 2020-06-04 NOTE — Progress Notes (Signed)
Verbal order received from [place], CNM for IVF challenge of of LR at 999 then a maintenance rate of 19mL/hr. Pitocin to be discontinued.

## 2020-06-04 NOTE — Lactation Note (Signed)
This note was copied from a baby's chart. Lactation Consultation Note  Patient Name: Alexandra [name] [id number] Date: 06/04/2020 Reason for consult: Initial assessment;Mother's request;Difficult latch;Primapara;Early term 37-38.6wks;Infant < 6lbs;Other (Comment) (Mom in birthplace on Mag)  When checked [name] for void or BM, he started to arouse and put his hand to his mouth.  Pointed out feeding cues to parents and mom willing to put him to the breast whenever he demonstrates hunger cues.  Mom had breast fed on left breast  approximately 3 hours earlier for 3 minutes.  Demonstrated hand expression of colostrum from both breasts, expressing more from left breast.  Areola is firm on both sides, but left is easier to compress and everts more when stimulated.  Placed [name] on right breast without nipple shield.  He would take a couple of sucks and then slip off fussing.  Tried left breast without nipple shield with same scenario.  Hand expressed colostrum into nipple shield.  Once #20 nipple shield was placed, he could sustain the latch and began strong rhythmic sucking with a few audible swallows.  He remained at the breast for 15 minutes with 12 minutes of actual sucking.  He came off the breast satiated.  Mom denies any breast or nipple pain.  Demonstrated how to hand express after breast feed and rub colostrum on nipple to prevent bacteria, lubricate and help with discomfort.  Mom is P1.  Mom has already received her Spectra S2 from her [facility] insurance.  FOB very supportive and involved. Discussed newborn stomach size, supply and demand, adequate intake and output, normal course of lactation, routine newborn feeding patterns and growth spurts.  Lactation name and number written on white board and encouraged to call with any questions, concerns or assistance.     Maternal Data Formula Feeding for Exclusion: No Has patient been taught Hand Expression?: Yes Does the patient have breastfeeding experience prior  to this delivery?: No (P1)  Feeding Feeding Type: Breast Fed  LATCH Score Latch: Repeated attempts needed to sustain latch, nipple held in mouth throughout feeding, stimulation needed to elicit sucking reflex.  Audible Swallowing: A few with stimulation  Type of Nipple: Everted at rest and after stimulation (Areola is difficult to compress)  Comfort (Breast/Nipple): Soft / non-tender  Hold (Positioning): Assistance needed to correctly position infant at breast and maintain latch.  LATCH Score: 7  Interventions Interventions: Breast feeding basics reviewed;Assisted with latch;Skin to skin;Breast massage;Hand express;Reverse pressure;Breast compression;Adjust position;Support pillows;Position options  Lactation Tools Discussed/Used Tools: Nipple Shields Nipple shield size: 20 WIC Program: No [facility] Insurance)   Consult Status Consult Status: Follow-up Date: 06/04/20 Follow-up type: Call as needed    Louis Meckel 06/04/2020, 2:03 PM

## 2020-06-04 NOTE — Anesthesia Postprocedure Evaluation (Signed)
Anesthesia Post Note  Patient: Alexandra [name]  Procedure(s) Performed: CESAREAN SECTION (N/A )  Patient location during evaluation: L&D Anesthesia Type: Epidural Level of consciousness: awake and alert and oriented Pain management: pain level controlled Vital Signs Assessment: post-procedure vital signs reviewed and stable Respiratory status: respiratory function stable Cardiovascular status: stable Postop Assessment: no headache, no backache, no apparent nausea or vomiting, patient able to bend at knees, adequate PO intake and able to ambulate Anesthetic complications: no Comments: Pt still on Mag gtt. Not walking around yet   No complications documented.   Last Vitals:  Vitals:   06/04/20 0729 06/04/20 0734  BP:    Pulse:    Resp:    Temp:    SpO2: 92% 93%    Last Pain:  Vitals:   06/04/20 0702  TempSrc: Oral  PainSc:                  Zachary George

## 2020-06-04 NOTE — Progress Notes (Signed)
RN notified Oxley, CNM of repeated low output numbers since 0700 as well as those received in report from outgoing night shift nurse. Output since 0700 has been as follows: 31mL / 11mL / 46mL. CNM told pt. to hydrate via PO oral hydration; RN supplied pt. with 1000 mL pitcher of ice water. Initiation of fluid/bolus declined at this time. Will continue to monitor closely.

## 2020-06-04 NOTE — Progress Notes (Signed)
RN reported 250 mL output from 1200-1300  1000-1100 11mL 1100-1200 46mL 1200-1300 

## 2020-06-04 NOTE — Op Note (Signed)
Alexandra [name], [name] MEDICAL RECORD ZO:[id number] ACCOUNT [id number] DATE OF BIRTH:03-24-1981 FACILITY: [facility] LOCATION: [facility] PHYSICIAN:Tekia Waterbury Cloyde Reams, MD  OPERATIVE REPORT  DATE OF PROCEDURE:  06/03/2020  PREOPERATIVE DIAGNOSES: 1.  37+1 weeks estimated gestational age. 2.  Active phase arrest. 3.  Failed continuous lumbar epidural x2. 4.  Patient elects for primary cesarean section.  POSTOPERATIVE DIAGNOSES:   1.  37+1 weeks estimated gestational age. 2.  Active phase arrest. 3.  Failed continuous lumbar epidural x2. 4.  Patient elects for primary cesarean section.  PROCEDURE:  Primary low transverse cesarean section.  ANESTHESIA:  Spinal.  SURGEON:  Jennell Corner, MD  FIRST ASSISTANT:  Heloise Ochoa, certified nurse midwife  INDICATIONS:  A 39 year old gravida 3, para 0 patient admitted to labor and delivery for gestational hypertension and presumed preeclampsia and was started on magnesium and underwent induction of labor.  The patient did not progress her cervix past 5 cm  despite adequate contraction pattern.  The patient had 2 separate epidurals, which did not relieve her pain adequately and she had requested a primary cesarean section at this point.  DESCRIPTION OF PROCEDURE:  After adequate spinal anesthesia, the patient was placed in dorsal supine position, hip roll under the right side.  The patient's abdomen was prepped and draped in normal sterile fashion.  Timeout was performed.  The patient  did receive 2 grams IV Ancef prior to commencement of the case and received 500 mg azithromycin as well.  A Pfannenstiel incision was made 2 fingerbreadths above the symphysis pubis.  Sharp dissection was used to identify the fascia.  Fascia was opened  in the midline and opened in a transverse fashion.  The superior aspect of fascia was grasped with Kocher clamps and the recti muscles were dissected free.  Inferior aspect of the fascia was grasped with Kocher  clamps and the pyramidalis muscle was  dissected free.  Entry into the peritoneal cavity was accomplished sharply.  The vesicouterine peritoneal fold was identified and opened in a transverse fashion and the bladder was reflected inferiorly.  A low transverse uterine incision was made.  Upon  entry into the endometrial cavity, clear fluid resulted.  The incision was extended with blunt transverse traction.  A small fetus was delivered through the incision with caput noted.  Somewhat floppy baby was then placed on the mother's abdomen with  cord still attached to the placenta.  Baby was stimulated and did not have spontaneous cry after approximately 30 seconds and the cord was then clamped and cut and infant was passed to nursery staff who did administer positive pressure ventilation and  assigned Apgar scores of 4 and 8.  Fetal weight 5 pounds 15 ounces.  Time of birth 27 on 06/03/2020.  Female infant. The placenta was then manually delivered and the uterus was exteriorized.  The endometrial cavity was wiped clean with laparotomy tape  and intravenous Pitocin was administered.  Uterine incision was closed with 1 chromic in a running locking fashion.  Good hemostasis noted.  The patient had a previous bilateral salpingectomy and the ovaries appeared normal.  Posterior cul-de-sac was  irrigated and suctioned.  Uterus was placed back into the abdominal cavity.  The pericolic gutters were wiped clean with laparotomy tape.  Uterine incision again appeared hemostatic.  Uterine incision was then covered with Interceed in a T-shape fashion.   Fascia was then closed with 0 Vicryl suture running nonlocking fashion.  Good approximation of edges.  Two separate sutures were used.  The  fascial edges were then injected with Exparel solution of 20 mL of 1.3% Exparel plus 30 mL of 0.5% Marcaine and  50 mL normal saline.  50 mL of the solution was injected subfascially.  Subcutaneous tissues were irrigated and bovied for  hemostasis and the skin was reapproximated with Insorb absorbable staples.  Good cosmetic effect.  Good hemostasis was noted.   Additional 30 mL of Exparel solution was injected beneath the skin for postoperative pain management.  The patient tolerated the procedure well.  ESTIMATED BLOOD LOSS:  600 mL.  INTRAOPERATIVE FLUIDS:  800 mL.  URINE OUTPUT:  50 mL.  DISPOSITION:  The patient was taken to recovery room in good condition.  CN/NUANCE  D:06/03/2020 T:06/04/2020 JOB:[id number]

## 2020-06-04 NOTE — Lactation Note (Addendum)
This note was copied from a baby's chart. Lactation Consultation Note  Patient Name: Alexandra [name] [id number] Date: 06/04/2020 Reason for consult: Follow-up assessment;Mother's request;Difficult latch;Primapara;Early term 37-38.6wks;Infant < 6lbs;Other (Comment) (Sleepy )  FOB called for assistance with putting [name] to the breast.  When went into room, [name] had voided and was working on bowel movement.  Once pamper was changed, we put him to the right breast.  Once again, he would not remain latched without nipple shield.  Assisted with positioning and pillow support.  He latched easily with nipple shield in place, but was only doing an occasional flutter suck.  Hand expressed a couple drops from left breast and put in nipple shield.  He started rhythmic sucking and mom could feel strong tugs.  He remained on the breast with nipple shield in place for 30 minutes but was only sucking about 10 to 12 minutes requiring frequent massaging of the breast and gentle stimulation of the chin. Maternal Data Formula Feeding for Exclusion: No Has patient been taught Hand Expression?: Yes Does the patient have breastfeeding experience prior to this delivery?: No (Gr3, but P1)  Feeding Feeding Type: Breast Fed  LATCH Score Latch: Repeated attempts needed to sustain latch, nipple held in mouth throughout feeding, stimulation needed to elicit sucking reflex.  Audible Swallowing: A few with stimulation  Type of Nipple: Everted at rest and after stimulation (Left more easy to evert than right)  Comfort (Breast/Nipple): Soft / non-tender  Hold (Positioning): Assistance needed to correctly position infant at breast and maintain latch.  LATCH Score: 7  Interventions Interventions: Assisted with latch;Skin to skin;Breast massage;Hand express;Reverse pressure;Breast compression;Adjust position;Support pillows;Position options  Lactation Tools Discussed/Used Tools: Nipple Dorris Carnes;Other (comment) (11 ml  vial for hand expression) Nipple shield size: 20 WIC Program: No [facility] Insurance)   Consult Status Consult Status: Follow-up Follow-up type: Call as needed    Louis Meckel 06/04/2020, 6:33 PM

## 2020-06-04 NOTE — Progress Notes (Signed)
C/w Dr. Feliberto Gottron about output - IVF challenge and then re-evaluate output.

## 2020-06-05 MED ORDER — LABETALOL HCL 200 MG PO TABS
200.0000 mg | ORAL_TABLET | Freq: Two times a day (BID) | ORAL | Status: DC
  Administered 2020-06-05 – 2020-06-06 (×3): 200 mg via ORAL
  Filled 2020-06-05 (×3): qty 1

## 2020-06-05 NOTE — Lactation Note (Signed)
This note was copied from a baby's chart. Lactation Consultation Note  Patient Name: Alexandra [name] [id number] Date: 06/05/2020 Reason for consult: Follow-up assessment  LC in to follow-up with parents and baby [name]. [name] had circumcision this morning, and mom was attempting to offer the breast. Mom was up in blue recliner chair, with good pillow support and [name] placed in football hold. Despite encouragement and initial latch onto the shield, [name] was sleepy and uninterested. LC provided reassurance to the parents re: Aaron's behavior following circumcision, but praised them for their attempt.  Mom had a question re: burping- Parents were encouraged to attempt burping, but also given guidance that baby may/may not burp, or it may not be loud- Baby is consuming colostrum, in smaller volume, but parents educated that this may change as milk transitions into mature milk and baby consumes a larger volume.  Encouraged continued feeding efforts and attempts frequently, offering LC support throughout the day as needed. Name/number updated on whiteboard, and parents encouraged to call with next feeding attempt or with any questions/concerns.  Maternal Data Formula Feeding for Exclusion: No Has patient been taught Hand Expression?: Yes (previous LC) Does the patient have breastfeeding experience prior to this delivery?: No  Feeding Feeding Type: Breast Fed  LATCH Score                   Interventions Interventions: Breast feeding basics reviewed  Lactation Tools Discussed/Used Tools: Nipple Shields Nipple shield size: 20   Consult Status Consult Status: Follow-up Date: 06/05/20 Follow-up type: In-patient    Danford Bad 06/05/2020, 10:30 AM

## 2020-06-05 NOTE — Progress Notes (Signed)
Post OpDay 2  Subjective: Doing well, no concerns. Ambulating without difficulty, pain managed with PO meds, tolerating regular diet, and voiding without difficulty.   No fever/chills, chest pain, shortness of breath, nausea/vomiting, or leg pain. No nipple or breast pain. No headache, visual changes, or RUQ/epigastric pain.  Objective: BP (!) 141/87 (BP Location: Right Arm)   Pulse 62   Temp 99.2 F (37.3 C) (Oral)   Resp 18   Ht 5\' 5"  (1.651 m)   Wt 98.9 kg Comment: 218lbs  LMP 09/17/2019   SpO2 98%   Breastfeeding Unknown   BMI 36.28 kg/m    Physical Exam:  General: alert, cooperative, appears stated age and no distress Breasts: soft/nontender CV: RRR Pulm: nl effort, CTABL Abdomen: soft, non-tender, active bowel sounds Uterine Fundus: firm Incision: healing well, no significant drainage, no dehiscence, no significant erythema Lochia: appropriate DVT Evaluation: No evidence of DVT seen on physical exam. No cords or calf tenderness. No significant calf/ankle edema.  Recent Labs    06/02/20 2337 06/04/20 0530  HGB 13.0 10.9*  HCT 37.5 30.5*  WBC 13.2* 14.4*  PLT 249 201    Assessment/Plan: 39 y.o. G3P1021 postop day # 2  -Continue routine postpartum care -Lactation consult PRN for breastfeeding  -Immunization status: all immunizations up to date -Started labetalol 200mg  BID for mild range blood pressures -Plan for discharge home tomorrow  Disposition: Continue inpatient postpartum care    LOS: 3 days   31, CNM 06/05/2020, 8:01 AM   ----- Genia Del Certified Nurse Midwife [facility]

## 2020-06-06 MED ORDER — ACETAMINOPHEN 500 MG PO TABS: 1000.0000 mg | ORAL_TABLET | Freq: Four times a day (QID) | ORAL | 0 refills | Status: DC

## 2020-06-06 MED ORDER — LABETALOL HCL 200 MG PO TABS: 200.0000 mg | ORAL_TABLET | Freq: Two times a day (BID) | ORAL | 0 refills | Status: DC

## 2020-06-06 MED ORDER — IBUPROFEN 800 MG PO TABS: 800.0000 mg | ORAL_TABLET | Freq: Four times a day (QID) | ORAL | 0 refills | Status: DC

## 2020-06-06 MED ORDER — SENNOSIDES-DOCUSATE SODIUM 8.6-50 MG PO TABS: 2.0000 | ORAL_TABLET | ORAL | 0 refills | Status: DC

## 2020-06-06 MED ORDER — OXYCODONE HCL 5 MG PO TABS: 5.0000 mg | ORAL_TABLET | Freq: Four times a day (QID) | ORAL | 0 refills | Status: AC | PRN

## 2020-06-06 NOTE — Progress Notes (Signed)
Patient discharged home with infant. Discharge instructions and prescriptions given and reviewed with patient. Patient verbalized understanding.   Escorted out by staff.  Explained that medications were changed to [facility] on [address] in [place] and would need to pick up narcotic at [facility] in [place]. Patient verbalized understanding.

## 2020-06-06 NOTE — Discharge Instructions (Signed)
Discharge Instructions:   Follow-up Appointment:  -2-day follow-up appointment for a blood pressure check with Heloise Ochoa, CNM: Friday, June 25th at 10:15am -2-week follow-up appointment with Dr. Feliberto Gottron: Tuesday, July 6th at 10:45am  If there are any new medications, they have been ordered and will be available for pickup at the listed pharmacy on your way home from the hospital.   Call office if you have any of the following: headache, visual changes, fever >101.0 F, chills, shortness of breath, breast concerns, excessive vaginal bleeding, incision drainage or problems, leg pain or redness, depression or any other concerns. If you have vaginal discharge with an odor, let your doctor know.   It is normal to bleed for up to 6 weeks. You should not soak through more than 1 pad in 1 hour. If you have a blood clot larger than your fist with continued bleeding, call your doctor.   After a c-section, you should expect a small amount of blood or clear fluid coming from the incision and abdominal cramping/soreness. Inspect your incision site daily. Stand in front of a mirror to look for any redness, incision opening, or discolored/odorness drainage. Take a shower daily and continue good hygiene. Use own towel and washcloth (do not share). Make sure your sheets on your bed are clean. No pets sleeping around your incision site. Dressing will be removed at your postpartum visit. If the dressing does become wet or soiled underneath, it is okay to remove it.   On-Q pump: You will remove on day 5 after insertion or if the ball becomes flat before day 5. You will remove on: Apr 17, 2019  Activity: Do not lift > 10 lbs for 6 weeks (do not lift anything heavier than your baby). No intercourse, tampons, swimming pools, hot tubs, baths (only showers) for 6 weeks.  No driving for 1-2 weeks. Continue prenatal vitamin, especially if breastfeeding. Increase calories and fluids (water) while breastfeeding.    Your milk will come in, in the next couple of days (right now it is colostrum). You may have a slight fever when your milk comes in, but it should go away on its own.  If it does not, and rises above 101 F please call the doctor. You will also feel achy and your breasts will be firm. They will also start to leak. If you are breastfeeding, continue as you have been and you can pump/express milk for comfort.   If you have too much milk, your breasts can become engorged, which could lead to mastitis. This is an infection of the milk ducts. It can be very painful and you will need to notify your doctor to obtain a prescription for antibiotics. You can also treat it with a shower or hot/cold compress.   For concerns about your baby, please call your pediatrician.  For breastfeeding concerns, the lactation consultant can be reached at [phone].   Postpartum blues (feelings of happy one minute and sad another minute) are normal for the first few weeks but if it gets worse let your doctor know.   Congratulations! We enjoyed caring for you and your new bundle of joy!

## 2020-06-06 NOTE — Lactation Note (Signed)
This note was copied from a baby's chart. Lactation Consultation Note  Patient Name: Alexandra [name] [id number] Date: 06/06/2020 Reason for consult: Follow-up assessment;1st time breastfeeding;Early term 37-38.6wks  LC in to see mom and baby [name] before discharge. Several feedings lasting between 4-7 minutes only, and introduction of formula since last visit yesterday. Mom has been concerned about baby's feeding, and opted for formula to ensure he was getting enough. This is mom's first baby, a c-section delivery, and hx of hypothyroidism, which all may impact that onset of lactogenesis II early on (delayed). Mom discussed a feeding plan with Aaron's Pediatrician this morning that includes: continue to offer breast, follow-up with supplement as needed. LC also encouraged the use of DEBP post BF (while dad gives supplement), for additional stimulation and milk removal; educating parents on milk supply and demand.  LC reviewed formula preparation and paced-bottle feeding, importance of offering breast first each time, pump use, milk storage and warming, breast care and nipple care, anticipated breast changes, signs of plugged ducts and mastitis, and when to seek MD care. Parents were given outpatient lactation contact information as well as community breastfeeding resources.  Encouraged to call out today with any additional questions/concerns before going home or BF support.  Maternal Data Formula Feeding for Exclusion: No Has patient been taught Hand Expression?: Yes Does the patient have breastfeeding experience prior to this delivery?: No  Feeding Feeding Type: Formula  LATCH Score                   Interventions Interventions: Breast feeding basics reviewed;Skin to skin;Hand express;DEBP  Lactation Tools Discussed/Used Tools: Nipple Dorris Carnes   Consult Status Consult Status: Complete Date: 06/06/20 Follow-up type: Call as needed    Danford Bad 06/06/2020, 10:42  AM

## 2020-06-08 MED ORDER — NIFEDIPINE ER OSMOTIC RELEASE 30 MG PO TB24
30.0000 mg | ORAL_TABLET | Freq: Every day | ORAL | 11 refills | Status: DC
Start: 2020-06-08 — End: 2020-06-12

## 2020-06-08 NOTE — Progress Notes (Signed)
Review of Systems: A full review of systems was performed and negative except as noted in the HPI.   Eyes: no vision change  Ears: left ear pain  Oropharynx: no sore throat  Pulmonary . No shortness of breath , no hemoptysis Cardiovascular: no chest pain , no irregular heart beat  Gastrointestinal:no blood in stool . No diarrhea, no constipation Uro gynecologic: no dysuria , no pelvic pain Neurologic : no seizure , no migraines    Musculoskeletal: no muscular weakness

## 2020-06-09 ENCOUNTER — Other Ambulatory Visit: Payer: Self-pay

## 2020-06-09 ENCOUNTER — Encounter: Payer: Self-pay | Admitting: Intensive Care

## 2020-06-09 ENCOUNTER — Inpatient Hospital Stay
Admission: AD | Admit: 2020-06-09 | Discharge: 2020-06-12 | DRG: 776 | Disposition: A | Discharge status: Home / Self Care | Payer: Commercial Managed Care - PPO

## 2020-06-09 DIAGNOSIS — R519 Headache, unspecified: Secondary | ICD-10-CM | POA: Diagnosis present

## 2020-06-09 DIAGNOSIS — Z20822 Contact with and (suspected) exposure to covid-19: Secondary | ICD-10-CM | POA: Diagnosis present

## 2020-06-09 DIAGNOSIS — O1415 Severe pre-eclampsia, complicating the puerperium: Principal | ICD-10-CM | POA: Diagnosis present

## 2020-06-09 DIAGNOSIS — O1495 Unspecified pre-eclampsia, complicating the puerperium: Secondary | ICD-10-CM | POA: Diagnosis present

## 2020-06-09 LAB — COMPREHENSIVE METABOLIC PANEL
ALT: 19 U/L (ref 0–44)
AST: 21 U/L (ref 15–41)
Albumin: 3.3 g/dL — ABNORMAL LOW (ref 3.5–5.0)
Alkaline Phosphatase: 111 U/L (ref 38–126)
Anion gap: 9 (ref 5–15)
BUN: 17 mg/dL (ref 6–20)
CO2: 24 mmol/L (ref 22–32)
Calcium: 9.3 mg/dL (ref 8.9–10.3)
Chloride: 105 mmol/L (ref 98–111)
Creatinine, Ser: 0.7 mg/dL (ref 0.44–1.00)
GFR calc Af Amer: >60 mL/min (ref >60)
GFR calc non Af Amer: >60 mL/min (ref >60)
Glucose, Bld: 100 mg/dL — ABNORMAL HIGH (ref 70–99)
Potassium: 4.3 mmol/L (ref 3.5–5.1)
Sodium: 138 mmol/L (ref 135–145)
Total Bilirubin: 0.7 mg/dL (ref 0.3–1.2)
Total Protein: 6.8 g/dL (ref 6.5–8.1)

## 2020-06-09 LAB — CBC WITH DIFFERENTIAL/PLATELET
Abs Immature Granulocytes: 0.02 10*3/uL (ref 0.00–0.07)
Basophils Absolute: 0 10*3/uL (ref 0.0–0.1)
Basophils Relative: 1 %
Eosinophils Absolute: 0.1 10*3/uL (ref 0.0–0.5)
Eosinophils Relative: 2 %
HCT: 32.6 % — ABNORMAL LOW (ref 36.0–46.0)
Hemoglobin: 11 g/dL — ABNORMAL LOW (ref 12.0–15.0)
Immature Granulocytes: 0 %
Lymphocytes Relative: 22 %
Lymphs Abs: 1.3 10*3/uL (ref 0.7–4.0)
MCH: 31.5 pg (ref 26.0–34.0)
MCHC: 33.7 g/dL (ref 30.0–36.0)
MCV: 93.4 fL (ref 80.0–100.0)
Monocytes Absolute: 0.5 10*3/uL (ref 0.1–1.0)
Monocytes Relative: 8 %
Neutro Abs: 3.8 10*3/uL (ref 1.7–7.7)
Neutrophils Relative %: 67 %
Platelets: 298 10*3/uL (ref 150–400)
RBC: 3.49 MIL/uL — ABNORMAL LOW (ref 3.87–5.11)
RDW: 12.2 % (ref 11.5–15.5)
WBC: 5.7 10*3/uL (ref 4.0–10.5)
nRBC: 0 % (ref 0.0–0.2)

## 2020-06-09 LAB — MAGNESIUM: Magnesium: 1.9 mg/dL (ref 1.7–2.4)

## 2020-06-09 LAB — SARS CORONAVIRUS 2 BY RT PCR (HOSPITAL ORDER, PERFORMED IN ~~LOC~~ HOSPITAL LAB): SARS Coronavirus 2: NEGATIVE

## 2020-06-09 MED ORDER — MAGNESIUM SULFATE 40 GM/1000ML IV SOLN
2.0000 g/h | INTRAVENOUS | Status: DC
  Administered 2020-06-09 – 2020-06-10 (×2): 2 g/h via INTRAVENOUS
  Filled 2020-06-09 (×2): qty 1000

## 2020-06-09 MED ORDER — LABETALOL HCL 5 MG/ML IV SOLN: 40.0000 mg | INTRAVENOUS | Status: DC | PRN

## 2020-06-09 MED ORDER — MAGNESIUM SULFATE BOLUS VIA INFUSION
4.0000 g | Freq: Once | INTRAVENOUS | Status: AC
  Administered 2020-06-09: 4 g via INTRAVENOUS
  Filled 2020-06-09: qty 1000

## 2020-06-09 MED ORDER — LACTATED RINGERS IV SOLN: INTRAVENOUS | Status: DC

## 2020-06-09 MED ORDER — CALCIUM GLUCONATE 10 % IV SOLN
INTRAVENOUS | Status: AC
  Filled 2020-06-09: qty 10

## 2020-06-09 MED ORDER — HYDRALAZINE HCL 20 MG/ML IJ SOLN: 10.0000 mg | INTRAMUSCULAR | Status: DC | PRN

## 2020-06-09 MED ORDER — ONDANSETRON HCL 4 MG/2ML IJ SOLN: 4.0000 mg | Freq: Four times a day (QID) | INTRAMUSCULAR | Status: DC | PRN

## 2020-06-09 MED ORDER — ACETAMINOPHEN 500 MG PO TABS: 1000.0000 mg | ORAL_TABLET | Freq: Four times a day (QID) | ORAL | Status: DC | PRN

## 2020-06-09 MED ORDER — LABETALOL HCL 5 MG/ML IV SOLN: 20.0000 mg | INTRAVENOUS | Status: DC | PRN

## 2020-06-09 MED ORDER — LABETALOL HCL 5 MG/ML IV SOLN: 80.0000 mg | INTRAVENOUS | Status: DC | PRN

## 2020-06-09 NOTE — ED Notes (Signed)
Pt to be a direct admit to labor and delivery. Pt taken upstairs by rebecca, rn.

## 2020-06-09 NOTE — ED Triage Notes (Signed)
Patient is 6days post partum and was sent by dr Bernestine Amass for HTN with headache. This RN called up to L&D and they reported once patient is evaluated by ER physician, if they determine this is a magnesium issue and she needs magnesium then she can be sent upstairs.

## 2020-06-09 NOTE — H&P (Addendum)
OB History & Physical   History of Present Illness:  Chief Complaint:  Headache   HPI:  Alexandra [name] is a 39 y.o. G47P1021 female postpartum day 6, presents to L&D for preeclampsia with severe features.   Reports b/p at home 160/100.  C/O of headache since this morning.  Denies changes in vision and RUQ pain.   Pregnancy Issues: Primary c/section for active phase arrest  Preeclampsia with severe features  IVF pregnancy  AMA Thyroid disease  Elevated 1 hr GTT - 3hr WNL  Patient Active Problem List   Diagnosis Date Noted   Preeclampsia in postpartum period 06/09/2020   Postoperative state 06/03/2020   Gestational hypertension affecting first pregnancy 06/02/2020   Elevated BP without diagnosis of hypertension 05/22/2020     Maternal Medical History:   Past Medical History:  Diagnosis Date   Hypothyroidism    Miscarriage    Thyroid disease     Past Surgical History:  Procedure Laterality Date   CESAREAN SECTION N/A 06/03/2020   Procedure: CESAREAN SECTION;  Surgeon: Schermerhorn, Ihor Austin, MD;  Location: [facility] ORS;  Service: Obstetrics;  Laterality: N/A;   Fertility Treatments      No Known Allergies  Prior to Admission medications   Medication Sig Start Date End Date Taking? Authorizing Provider  acetaminophen (TYLENOL) 500 MG tablet Take 2 tablets (1,000 mg total) by mouth every 6 (six) hours. 06/06/20   McVey, Prudencio Pair, CNM  ibuprofen (ADVIL) 800 MG tablet Take 1 tablet (800 mg total) by mouth every 6 (six) hours. 06/06/20   McVey, Prudencio Pair, CNM  labetalol (NORMODYNE) 200 MG tablet Take 1 tablet (200 mg total) by mouth 2 (two) times daily. Hold dose for BP less than 100/60 06/06/20   McVey, Prudencio Pair, CNM  levothyroxine (SYNTHROID) 100 MCG tablet Take 100 mcg by mouth daily before breakfast.    [provider]  NIFEdipine (PROCARDIA-XL/NIFEDICAL-XL) 30 MG 24 hr tablet Take 1 tablet (30 mg total) by mouth daily. 06/08/20 06/08/21  Schermerhorn, Ihor Austin, MD   oxyCODONE (OXY IR/ROXICODONE) 5 MG immediate release tablet Take 1 tablet (5 mg total) by mouth every 6 (six) hours as needed for up to 7 days for moderate pain. 06/06/20 06/13/20  McVey, Prudencio Pair, CNM  Prenatal Vit-Fe Fumarate-FA (PRENATAL MULTIVITAMIN) TABS tablet Take 1 tablet by mouth daily at 12 noon.    [provider]  senna-docusate (SENOKOT-S) 8.6-50 MG tablet Take 2 tablets by mouth daily. 06/06/20   McVey, Prudencio Pair, CNM     Prenatal care site:  [facility]  Social History: She  reports that she has never smoked. She has never used smokeless tobacco. She reports that she does not drink alcohol and does not use drugs.  Family History: family history includes Hypertension in her father.   Review of Systems: A full review of systems was performed and negative except as noted in the HPI.     Physical Exam:  Vital Signs: BP (!) 161/105 (BP Location: Left Arm)   Pulse 77   Temp 98 F (36.7 C) (Oral)   Resp 16   Ht 5\' 5"  (1.651 m)   Wt 90.7 kg   SpO2 99%   Breastfeeding Yes Comment: formula and breast  BMI 33.28 kg/m    Physical Exam Constitutional:      Appearance: Normal appearance.  Cardiovascular:     Rate and Rhythm: Normal rate and regular rhythm.     Pulses: Normal pulses.  Pulmonary:  Effort: Pulmonary effort is normal.     Breath sounds: Normal breath sounds.  Abdominal:     General: Bowel sounds are normal.     Palpations: Abdomen is soft.  Musculoskeletal:     Cervical back: Normal range of motion.     Right lower leg: 2+ Edema present.     Left lower leg: 2+ Edema present.  Skin:    General: Skin is warm and dry.     Capillary Refill: Capillary refill takes less than 2 seconds.  Neurological:     Mental Status: She is alert and oriented to person, place, and time.     Deep Tendon Reflexes:     Reflex Scores:      Patellar reflexes are 3+ on the right side and 3+ on the left side. Psychiatric:        Mood and Affect: Mood  normal.     Results for orders placed or performed during the hospital encounter of 06/09/20 (from the past 24 hour(s))  CBC with Differential     Status: Abnormal   Collection Time: 06/09/20  7:04 PM  Result Value Ref Range   WBC 5.7 4.0 - 10.5 K/uL   RBC 3.49 (L) 3.87 - 5.11 MIL/uL   Hemoglobin 11.0 (L) 12.0 - 15.0 g/dL   HCT 16.1 (L) 36 - 46 %   MCV 93.4 80.0 - 100.0 fL   MCH 31.5 26.0 - 34.0 pg   MCHC 33.7 30.0 - 36.0 g/dL   RDW 09.6 04.5 - 40.9 %   Platelets 298 150 - 400 K/uL   nRBC 0.0 0.0 - 0.2 %   Neutrophils Relative % 67 %   Neutro Abs 3.8 1.7 - 7.7 K/uL   Lymphocytes Relative 22 %   Lymphs Abs 1.3 0.7 - 4.0 K/uL   Monocytes Relative 8 %   Monocytes Absolute 0.5 0 - 1 K/uL   Eosinophils Relative 2 %   Eosinophils Absolute 0.1 0 - 0 K/uL   Basophils Relative 1 %   Basophils Absolute 0.0 0 - 0 K/uL   Immature Granulocytes 0 %   Abs Immature Granulocytes 0.02 0.00 - 0.07 K/uL  Comprehensive metabolic panel     Status: Abnormal   Collection Time: 06/09/20  7:04 PM  Result Value Ref Range   Sodium 138 135 - 145 mmol/L   Potassium 4.3 3.5 - 5.1 mmol/L   Chloride 105 98 - 111 mmol/L   CO2 24 22 - 32 mmol/L   Glucose, Bld 100 (H) 70 - 99 mg/dL   BUN 17 6 - 20 mg/dL   Creatinine, Ser 8.11 0.44 - 1.00 mg/dL   Calcium 9.3 8.9 - 91.4 mg/dL   Total Protein 6.8 6.5 - 8.1 g/dL   Albumin 3.3 (L) 3.5 - 5.0 g/dL   AST 21 15 - 41 U/L   ALT 19 0 - 44 U/L   Alkaline Phosphatase 111 38 - 126 U/L   Total Bilirubin 0.7 0.3 - 1.2 mg/dL   GFR calc non Af Amer >60 >60 mL/min   GFR calc Af Amer >60 >60 mL/min   Anion gap 9 5 - 15  Magnesium     Status: None   Collection Time: 06/09/20  7:04 PM  Result Value Ref Range   Magnesium 1.9 1.7 - 2.4 mg/dL    Assessment:  Alexandra [name] is a 39 y.o. G68P1021 female, postpartum day 6 with preeclampsia with severe features.   Plan:  1. Admit to Labor &  Delivery; consents reviewed and obtained - Dr. Leafy Ro notified of admission  2.  Preeclampsia - Mag Sulfate 4gram IV bolus then 2 grams/hr x 24 hours  - Total IV fluids to 154ml/hr  - Plan to transition to oral antihypertensives once mag sulfate infusion compete   3. Labs and I&0 - Preeclampsia labs WNL, will recheck in am - Clear fluids, IVF - I&O q1 hr while on mag sulfate  - May be up to bathroom to void  - place foley if urine output less than 134ml q 4hours   4. Breastfeeding  - Currently breastfeeding  - Lactation consult PRN  - Partner will remain at bedside with infant    Minda Meo, CNM 06/09/20 9:02 PM  Drinda Butts, CNM Certified Nurse Midwife [facility]    Agree with the above findings and plan of care. Severe range BP 6 days after delivery for gHTN with severe headache, labs wnl, iv antihypertensives, plan for 24hrs of magnesium. Currently needs inpatient monitoring.  Benjaman Kindler

## 2020-06-10 LAB — CBC
HCT: 34.6 % — ABNORMAL LOW (ref 36.0–46.0)
Hemoglobin: 11.8 g/dL — ABNORMAL LOW (ref 12.0–15.0)
MCH: 31.4 pg (ref 26.0–34.0)
MCHC: 34.1 g/dL (ref 30.0–36.0)
MCV: 92 fL (ref 80.0–100.0)
Platelets: 345 10*3/uL (ref 150–400)
RBC: 3.76 MIL/uL — ABNORMAL LOW (ref 3.87–5.11)
RDW: 12.1 % (ref 11.5–15.5)
WBC: 5.5 10*3/uL (ref 4.0–10.5)
nRBC: 0 % (ref 0.0–0.2)

## 2020-06-10 LAB — COMPREHENSIVE METABOLIC PANEL
ALT: 18 U/L (ref 0–44)
AST: 24 U/L (ref 15–41)
Albumin: 3.5 g/dL (ref 3.5–5.0)
Alkaline Phosphatase: 112 U/L (ref 38–126)
Anion gap: 10 (ref 5–15)
BUN: 11 mg/dL (ref 6–20)
CO2: 25 mmol/L (ref 22–32)
Calcium: 8.1 mg/dL — ABNORMAL LOW (ref 8.9–10.3)
Chloride: 104 mmol/L (ref 98–111)
Creatinine, Ser: 0.64 mg/dL (ref 0.44–1.00)
GFR calc Af Amer: >60 mL/min (ref >60)
GFR calc non Af Amer: >60 mL/min (ref >60)
Glucose, Bld: 107 mg/dL — ABNORMAL HIGH (ref 70–99)
Potassium: 3.9 mmol/L (ref 3.5–5.1)
Sodium: 139 mmol/L (ref 135–145)
Total Bilirubin: 0.7 mg/dL (ref 0.3–1.2)
Total Protein: 6.6 g/dL (ref 6.5–8.1)

## 2020-06-10 LAB — MAGNESIUM: Magnesium: 5.4 mg/dL — ABNORMAL HIGH (ref 1.7–2.4)

## 2020-06-10 MED ORDER — IBUPROFEN 800 MG PO TABS
800.0000 mg | ORAL_TABLET | Freq: Four times a day (QID) | ORAL | Status: DC | PRN
  Administered 2020-06-10: 800 mg via ORAL
  Filled 2020-06-10: qty 1

## 2020-06-10 MED ORDER — PRENATAL MULTIVITAMIN CH
1.0000 | ORAL_TABLET | Freq: Every day | ORAL | Status: DC
  Administered 2020-06-10 – 2020-06-12 (×3): 1 via ORAL
  Filled 2020-06-10 (×3): qty 1

## 2020-06-10 MED ORDER — LEVOTHYROXINE SODIUM 100 MCG PO TABS
100.0000 ug | ORAL_TABLET | Freq: Every day | ORAL | Status: DC
  Administered 2020-06-10 – 2020-06-12 (×3): 100 ug via ORAL
  Filled 2020-06-10 (×4): qty 1

## 2020-06-10 MED ORDER — NIFEDIPINE ER OSMOTIC RELEASE 30 MG PO TB24
30.0000 mg | ORAL_TABLET | Freq: Every day | ORAL | Status: DC
  Administered 2020-06-10: 30 mg via ORAL
  Filled 2020-06-10: qty 1

## 2020-06-10 MED ORDER — OXYCODONE HCL 5 MG PO TABS: 5.0000 mg | ORAL_TABLET | Freq: Four times a day (QID) | ORAL | Status: DC | PRN

## 2020-06-10 MED ORDER — BREAST MILK/FORMULA (FOR LABEL PRINTING ONLY): ORAL | Status: DC

## 2020-06-10 MED ORDER — SENNOSIDES-DOCUSATE SODIUM 8.6-50 MG PO TABS: 2.0000 | ORAL_TABLET | Freq: Every day | ORAL | Status: DC | PRN

## 2020-06-10 MED ORDER — NIFEDIPINE ER OSMOTIC RELEASE 30 MG PO TB24
60.0000 mg | ORAL_TABLET | Freq: Every day | ORAL | Status: DC
  Administered 2020-06-11 – 2020-06-12 (×2): 60 mg via ORAL
  Filled 2020-06-10 (×2): qty 2

## 2020-06-10 MED ORDER — NIFEDIPINE ER OSMOTIC RELEASE 30 MG PO TB24
30.0000 mg | ORAL_TABLET | Freq: Once | ORAL | Status: AC
  Administered 2020-06-10: 30 mg via ORAL
  Filled 2020-06-10: qty 1

## 2020-06-10 NOTE — Progress Notes (Signed)
Post OP Day 7 - admission for postpartum preeclampsia   Subjective: voiding and tolerating PO  Ambulating without difficulty to bedside commode, pain managed with PO meds, tolerating clear liquid diet, and voiding without difficulty.   No fever/chills, chest pain, shortness of breath, nausea/vomiting, or leg pain. No nipple or breast pain. No visual changes, or RUQ/epigastric pain.  Reports headache improved last night but just started to return this morning.  Rates 4/10.  Objective: BP (!) 147/91 (BP Location: Left Arm)    Pulse 71    Temp 98.1 F (36.7 C) (Oral)    Resp 16    Ht 5\' 5"  (1.651 m)    Wt 90.7 kg    SpO2 93%    Breastfeeding Yes Comment: formula and breast   BMI 33.28 kg/m    Physical Exam:  General: alert, cooperative and no distress Breasts: soft/nontender CV: RRR Pulm: nl effort, CTABL Neuro: DTR 2+/2+, no clonus  Abdomen: soft, non-tender, active bowel sounds Extremities: LLE edema 1+, generalized  Uterine Fundus: firm Incision: healing well Lochia: scant to light DVT Evaluation: No evidence of DVT seen on physical exam.  Recent Labs    06/09/20 1904 06/10/20 0616  HGB 11.0* 11.8*  HCT 32.6* 34.6*  WBC 5.7 5.5  PLT 298 345   I/O last 3 completed shifts: In: 1748.9 [P.O.:650; I.V.:1098.9] Out: 2900 [Urine:2900]   Assessment/Plan: 39 y.o. G3P1021 postpartum day # 7, admission for postpartum preeclampsia   -Continue mag sulfate infusion for 24 hours -Blood pressures remain in the 140-150's/90's, no severe ranges requiring treatment.  -Preeclampsia labs WNL -Urine output adequate  -LE swelling has significantly improved over night -Her headache had initially improved but has returned since this morning.  Declines Tylenol at this time.  -PO procardia restarted.  Will given 2nd 30mg  and increase daily dosage to 60mg  PO daily.   -Lactation consult PRN for breastfeeding   Disposition: Continue inpatient postpartum care    LOS: 1 day   31,  CNM 06/10/2020, 3:08 PM   -----  Certified Nurse Midwife [facility]

## 2020-06-10 NOTE — Lactation Note (Signed)
Lactation Consultation Note  Patient Name: Jazzalynn Rhudy BSWHQ'P Date: 06/10/2020   Mom has been putting [name] to the breast, giving a bottle of 30 ml of formula and pumping at home per suggestion of Pediatrician.  Mom was readmitted last night and started on Magnesium Sulfate.  [name] is 50 days old.  When went in to see mom, she was struggling to get [name] to latch and her breasts were hard and difficult to compress.  Hand expressed to soften areola and then got [name] to latch with #20 nipple shield.  He nursed for 20 minutes with strong, rhythmic sucking and frequent swallows.  Right breast was much softer after breast feed.  [facility] DEBP set up in room with instructions in breast massage, hand expression, pumping, collection, storage, cleaning, labeling and handling of expressed milk.  Mom expressed 50 ml of transitional breast milk.  Left 25 ml out to give [name] if needed.  For now he is satiated from breast feed.  Placed 25 ml in nursery refrigerator.  Mom was pleased with feeding.  Lactation name and number written on white board and encouraged to call with any questions, concerns or assistance.    Maternal Data    Feeding    LATCH Score                   Interventions    Lactation Tools Discussed/Used     Consult Status      Louis Meckel 06/10/2020, 9:31 PM

## 2020-06-11 MED ORDER — LABETALOL HCL 200 MG PO TABS
200.0000 mg | ORAL_TABLET | Freq: Two times a day (BID) | ORAL | Status: DC
  Administered 2020-06-11 – 2020-06-12 (×2): 200 mg via ORAL
  Filled 2020-06-11 (×2): qty 1

## 2020-06-11 NOTE — Progress Notes (Signed)
Post OP Day 8 - admission for postpartum preeclampsia   Subjective: Ambulating without difficulty, pain managed with PO meds, tolerating regular diet, and voiding without difficulty.   No fever/chills, chest pain, shortness of breath, nausea/vomiting, or leg pain. No nipple or breast pain. Denies headache, visual changes, or RUQ/epigastric pain.    Objective: BP 132/89   Pulse 80   Temp 98.8 F (37.1 C) (Oral)   Resp 20   Ht 5\' 5"  (1.651 m)   Wt 90.7 kg   SpO2 98%   Breastfeeding Yes Comment: formula and breast  BMI 33.28 kg/m    Physical Exam:  General: alert and cooperative Breasts: soft/nontender CV: RRR Pulm: nl effort Abdomen: soft, non-tender Uterine Fundus: firm Lochia: min DVT Evaluation: No evidence of DVT seen on physical exam.  Recent Labs    06/09/20 1904 06/10/20 0616  HGB 11.0* 11.8*  HCT 32.6* 34.6*  WBC 5.7 5.5  PLT 298 345    Assessment/Plan: 39 y.o. G3P1021 postpartum day # 8, admission for postpartum preeclampsia   -Mag sulfate d/c'd yesterday at 2154 -Blood pressure range 135-140 / 75-91, no Pre-E s/s  -Urine output adequate  -PO procardia 60mg  PO daily - first dose this am around 1000   -Lactation consult PRN for breastfeeding   Disposition: Continue inpatient postpartum care   LOS: 2 days   2155, CNM 06/11/2020, 7:59 AM

## 2020-06-12 MED ORDER — NIFEDIPINE ER OSMOTIC RELEASE 60 MG PO TB24
60.0000 mg | ORAL_TABLET | Freq: Every day | ORAL | 1 refills | Status: DC
Start: 2020-06-12 — End: 2024-03-15

## 2020-06-12 MED ORDER — LABETALOL HCL 200 MG PO TABS: 200.0000 mg | ORAL_TABLET | Freq: Two times a day (BID) | ORAL | 1 refills | Status: DC

## 2020-06-12 MED ORDER — IBUPROFEN 800 MG PO TABS: 800.0000 mg | ORAL_TABLET | Freq: Four times a day (QID) | ORAL | Status: DC | PRN

## 2020-06-12 MED ORDER — ACETAMINOPHEN 500 MG PO TABS: 1000.0000 mg | ORAL_TABLET | Freq: Four times a day (QID) | ORAL | Status: DC | PRN

## 2020-06-12 NOTE — Progress Notes (Signed)
Patient discharged home. Discharge instructions and prescriptions given and reviewed with patient. Patient verbalized understanding.   Follow-up appointment scheduled.  Escorted out by staff.

## 2020-06-12 NOTE — Discharge Instructions (Signed)
After Your Delivery Discharge Instructions °  °Postpartum: Care Instructions ° °After childbirth (postpartum period), your body goes through many changes. Some of these changes happen over several weeks. In the hours after delivery, your body will begin to recover from childbirth while it prepares to breastfeed your newborn. You may feel emotional during this time. Your hormones can shift your mood without warning for no clear reason. ° °In the first couple of weeks after childbirth, many women have emotions that change from happy to sad. You may find it hard to sleep. You may cry a lot. This is called the "baby blues." These overwhelming emotions often go away within a couple of days or weeks. But it's important to discuss your feelings with your doctor. ° °You should call your care provider if you have unrelieved feelings of: °· Inability to cope °· Sadness °· Anxiety °· Lack of interest in baby °· Insomnia °· Crying ° °It is easy to get too tired and overwhelmed during the first weeks after childbirth. Don't try to do too much. Get rest whenever you can, accept help from others, and eat well and drink plenty of fluids. ° °About 4 to 6 weeks after your baby's birth, you will have a follow-up visit with your care provider. This visit is your time to talk to your provider about anything you are concerned or curious about. ° °Follow-up care is a key part of your treatment and safety. Be sure to make and go to all appointments, and call your doctor if you are having problems. It's also a good idea to know your test results and keep a list of the medicines you take. ° °How can you care for yourself at home? °· Sleep or rest when your baby sleeps. °· Get help with household chores from family or friends, if you can. Do not try to do it all yourself. °· If you have hemorrhoids or swelling or pain around the opening of your vagina, try using cold and heat. You can put ice or a cold pack on the area for 10 to 20 minutes at  a time. Put a thin cloth between the ice and your skin. Also try sitting in a few inches of warm water (sitz bath) 3 times a day and after bowel movements. °· Take pain medicines exactly as directed. °· If the provider gave you a prescription medicine for pain, take it as prescribed. °· If you do not have a prescription and need something over the counter, you can take: °· Ibuprofen (Motrin, Advil) up to 600mg every 6 hours as needed for pain °· Acetaminophen (Tylenol) up to 650mg every 4 hours as needed for pain °· Some people find it helpful to alternate between these two medications.  °· No driving for 1-2 weeks or while taking pain medications.  °· Eat more fiber to avoid constipation. Include foods such as whole-grain breads and cereals, raw vegetables, raw and dried fruits, and beans. °· Drink plenty of fluids, enough so that your urine is light yellow or clear like water. If you have kidney, heart, or liver disease and have to limit fluids, talk with your doctor before you increase the amount of fluids you drink. °· Do not put anything in the vagina for 6 weeks. This means no sex, no tampons, no douching, and no enemas. °· If you have stitches, keep the area clean by pouring or spraying warm water over the area outside your vagina and anus after you use the toilet. °·   No strenuous activity or heavy lifting for 6 weeks  °· No tub baths; showers only °· Continue prenatal vitamin and iron. °· If breastfeeding: °· Increase calories and fluids while breastfeeding. °· You may have a slight fever when your milk comes in, but it should go away on its own. If it does not, and rises above 101.0 please call the doctor. °· For breastfeeding concerns, the lactation consultant can be reached at [phone]. °· For concerns about your baby, please call your pediatrician. ° °· Keep a list of questions to bring to your postpartum visit. Your questions might be about: °· Changes in your breasts, such as lumps or  soreness. °· When to expect your menstrual period to start again. °· What form of birth control is best for you. °· Weight you have put on during the pregnancy. °· Exercise options. °· What foods and drinks are best for you, especially if you are breastfeeding. °· Problems you might be having with breastfeeding. °· When you can have sex. Some women may want to talk about lubricants for the vagina. °· Any feelings of sadness or restlessness that you are having. ° ° °When should you call for help? ° °Call 911 anytime you think you may need emergency care. For example, call if: °· You have thoughts of harming yourself, your baby, or another person. °· You passed out (lost consciousness). ° °Call the office at [phone] or seek immediate medical care if: °· If you have heavy bleeding such that you are soaking 1 pad in an hour for 2 hours °· You are dizzy or lightheaded, or you feel like you may faint. °· You have a fever; a temperature of 101.0 F or greater °· Chills °· Difficulty urinating °· Headache unrelieved by "pain meds"  °· Visual changes °· Pain in the right side of your belly near your ribs °· Breasts reddened, hard, hot to the touch or any other breast concerns °· Nipple discharge which is foul-smelling or contains pus  °· Increased pain at the site of the surgical incision  °· Incision drainage or problems °· New pain unrelieved with recommended over-the-counter dosages °· Difficulty breathing with or without chest pain  °· New leg pain, swelling, or redness, especially if it is only on one leg °· Any other concerns ° °Watch closely for changes in your health, and be sure to contact your provider if: °· You have new or worse vaginal discharge. °· You feel sad or depressed. °· You are having problems with your breasts or breastfeeding. ° ° ° °

## 2020-06-12 NOTE — Discharge Summary (Signed)
Obstetric Discharge Summary   Patient Name: Alexandra [name] DOB: 03-May-1981 MRN: [id number]  Date of Re-admission: 06/09/2020 Date of Delivery: 06/03/2020 Date of Discharge: 06/12/2020  Primary OB: [facility] LMP:No LMP recorded.  Antepartum complications:  Pregnancy Issues: 1. Primary c/section for active phase arrest  2. Preeclampsia with severe features  3. IVF pregnancy  4. AMA 5. Thyroid disease  6. Elevated 1 hr GTT - 3hr WNL   Admitting Diagnosis: postpartum preeclampsia with severe features by severe range blood pressure and headache  Secondary Diagnoses: Patient Active Problem List   Diagnosis Date Noted  . Preeclampsia in postpartum period 06/09/2020  . Postoperative state 06/03/2020  . Gestational hypertension affecting first pregnancy 06/02/2020  . Elevated BP without diagnosis of hypertension 05/22/2020    Treatments: magnesium sulfate IV x 24 hours  Hospital Course:  This is a 39 y.o. G3P1021 6 days postpartum after cesarean delivery for active phase arrest, readmitted for postpartum preeclampsia with severe features by severe range blood pressures and headache. She was treated with magnesium sulfate IV x 24 hours, with 4g bolus followed by 2g/hr for the rest of the time. Blood pressure was ultimately managed with nifedipine XL 60mg  PO daily and labetalol 200mg  PO BID. Her headache resolved, labs showed no other signs of severe features of preeclampsia, and she had no other signs of severe features of preeclampsia. She was deemed stable for discharge to home with outpatient follow up.       Labs: CBC Latest Ref Rng & Units 06/10/2020 06/09/2020 06/04/2020  WBC 4.0 - 10.5 K/uL 5.5 5.7 14.4(H)  Hemoglobin 12.0 - 15.0 g/dL 11.8(L) 11.0(L) 10.9(L)  Hematocrit 36 - 46 % 34.6(L) 32.6(L) 30.5(L)  Platelets 150 - 400 K/uL 345 298 201   O POS  Physical exam:  BP 121/78 (BP Location: Left Arm)   Pulse 67   Temp 98.5 F (36.9 C) (Oral)   Resp 18   Ht 5\' 5"   (1.651 m)   Wt 90.7 kg   SpO2 100%   Breastfeeding Yes Comment: formula and breast  BMI 33.28 kg/m  General: alert and no distress Pulm: normal respiratory effort Lochia: appropriate Abdomen: soft, NT Uterine Fundus: firm, below umbilicus Incision: c/d/i, healing well, no significant drainage, no dehiscence, no significant erythema Extremities: No evidence of DVT seen on physical exam. No lower extremity edema.   Disposition: stable, discharge to home Baby Feeding: breastmilk Baby Disposition: home with mom   Plan:  Rasheeda Mulvehill was discharged to home in good condition. Follow-up appointment at [facility] with delivery provider in 2-3 days for blood pressure check  Discharge Instructions: Per After Visit Summary. Activity: Advance as tolerated. Pelvic rest for 6 weeks.   Diet: Regular Discharge Medications: Allergies as of 06/12/2020   No Known Allergies     Medication List    TAKE these medications   acetaminophen 500 MG tablet Commonly known as: TYLENOL Take 2 tablets (1,000 mg total) by mouth every 6 (six) hours as needed for mild pain or moderate pain. What changed:   when to take this  reasons to take this   ibuprofen 800 MG tablet Commonly known as: ADVIL Take 1 tablet (800 mg total) by mouth every 6 (six) hours as needed for mild pain, moderate pain or cramping. What changed:   when to take this  reasons to take this   labetalol 200 MG tablet Commonly known as: NORMODYNE Take 1 tablet (200 mg total) by mouth 2 (two) times daily.  Hold dose for BP less than 100/60   levothyroxine 100 MCG tablet Commonly known as: SYNTHROID Take 100 mcg by mouth daily before breakfast.   NIFEdipine 60 MG 24 hr tablet Commonly known as: PROCARDIA XL/NIFEDICAL XL Take 1 tablet (60 mg total) by mouth daily. What changed:   medication strength  how much to take   oxyCODONE 5 MG immediate release tablet Commonly known as: Oxy IR/ROXICODONE Take 1  tablet (5 mg total) by mouth every 6 (six) hours as needed for up to 7 days for moderate pain.   prenatal multivitamin Tabs tablet Take 1 tablet by mouth daily at 12 noon.   senna-docusate 8.6-50 MG tablet Commonly known as: Senokot-S Take 2 tablets by mouth daily.      Outpatient follow up:   Follow-up Information    [facility]. Schedule an appointment as soon as possible for a visit in 3 day(s).   Why: For blood pressure check Contact information: [address] [place] [address] [phone]               Signed:  Genia Del, CNM 06/12/2020 9:33 AM

## 2021-06-17 ENCOUNTER — Ambulatory Visit
Admission: RE | Admit: 2021-06-17 | Discharge: 2021-06-17 | Disposition: A | Discharge status: Home / Self Care | Payer: Commercial Managed Care - PPO | Source: Ambulatory Visit | Attending: Emergency Medicine | Admitting: Emergency Medicine

## 2021-06-17 ENCOUNTER — Other Ambulatory Visit: Payer: Self-pay

## 2021-06-17 VITALS — BP 133/84 | HR 105 | Temp 99.0°F | Resp 16 | Ht 65.0 in | Wt 188.0 lb

## 2021-06-17 DIAGNOSIS — B349 Viral infection, unspecified: Secondary | ICD-10-CM | POA: Diagnosis not present

## 2021-06-17 DIAGNOSIS — Z1152 Encounter for screening for COVID-19: Secondary | ICD-10-CM | POA: Insufficient documentation

## 2021-06-17 LAB — POCT RAPID STREP A (OFFICE): Rapid Strep A Screen: NEGATIVE

## 2021-06-17 NOTE — ED Triage Notes (Signed)
Pt reports having body aches, chills, and fever x2 days. 2 neg home test within last 2 days.

## 2021-06-17 NOTE — Discharge Instructions (Addendum)
Your rapid strep test is negative.  A throat culture is pending; we will call you if it is positive requiring treatment.    Your COVID test is pending.  You should self quarantine until the test result is back.    Take Tylenol or ibuprofen as needed for fever or discomfort.  Rest and keep yourself hydrated.    Follow-up with your primary care provider if your symptoms are not improving.     

## 2021-06-17 NOTE — ED Provider Notes (Signed)
Renaldo Fiddler    CSN: [id number] Arrival date & time: 06/17/21  1223      History   Chief Complaint Chief Complaint  Patient presents with   Generalized Body Aches    HPI Alexandra [name] is a 40 y.o. female.  Patient presents with 2-day history of fever, chills, body aches, sore throat.  T-max 102 last night.  Treatment at home with ibuprofen yesterday; no medications taken today.  She denies rash, cough, shortness of breath, vomiting, diarrhea, or other symptoms.  She reports 2 negative at-home COVID test.  Her medical history includes hypothyroidism.  The history is provided by the patient and medical records.   Past Medical History:  Diagnosis Date   Hypothyroidism    Miscarriage    Thyroid disease     Patient Active Problem List   Diagnosis Date Noted   Preeclampsia in postpartum period 06/09/2020   Postoperative state 06/03/2020   Gestational hypertension affecting first pregnancy 06/02/2020   Elevated BP without diagnosis of hypertension 05/22/2020    Past Surgical History:  Procedure Laterality Date   CESAREAN SECTION N/A 06/03/2020   Procedure: CESAREAN SECTION;  Surgeon: Feliberto Gottron, Ihor Austin, MD;  Location: [facility] ORS;  Service: Obstetrics;  Laterality: N/A;   Fertility Treatments      OB History     Gravida  3   Para  1   Term  1   Preterm      AB  2   Living  1      SAB  1   IAB      Ectopic      Multiple  0   Live Births  1            Home Medications    Prior to Admission medications   Medication Sig Start Date End Date Taking? Authorizing Provider  acetaminophen (TYLENOL) 500 MG tablet Take 2 tablets (1,000 mg total) by mouth every 6 (six) hours as needed for mild pain or moderate pain. 06/12/20   Genia Del, CNM  ibuprofen (ADVIL) 800 MG tablet Take 1 tablet (800 mg total) by mouth every 6 (six) hours as needed for mild pain, moderate pain or cramping. 06/12/20   Genia Del, CNM  labetalol (NORMODYNE)  200 MG tablet Take 1 tablet (200 mg total) by mouth 2 (two) times daily. Hold dose for BP less than 100/60 06/12/20   Genia Del, CNM  levothyroxine (SYNTHROID) 100 MCG tablet Take 100 mcg by mouth daily before breakfast.    [provider]  NIFEdipine (PROCARDIA XL/NIFEDICAL XL) 60 MG 24 hr tablet Take 1 tablet (60 mg total) by mouth daily. 06/12/20   Genia Del, CNM  Prenatal Vit-Fe Fumarate-FA (PRENATAL MULTIVITAMIN) TABS tablet Take 1 tablet by mouth daily at 12 noon.    [provider]  senna-docusate (SENOKOT-S) 8.6-50 MG tablet Take 2 tablets by mouth daily. Patient not taking: Reported on 06/09/2020 06/06/20   McVey, Prudencio Pair, CNM    Family History Family History  Problem Relation Age of Onset   Hypertension Father     Social History Social History   Tobacco Use   Smoking status: Never   Smokeless tobacco: Never  Vaping Use   Vaping Use: Never used  Substance Use Topics   Alcohol use: Never   Drug use: Never     Allergies   Patient has no known allergies.   Review of Systems Review of Systems  Constitutional:  Positive for chills and fever.  HENT:  Positive for sore throat. Negative for ear pain.   Respiratory:  Negative for cough and shortness of breath.   Cardiovascular:  Negative for chest pain and palpitations.  Gastrointestinal:  Negative for abdominal pain and vomiting.  Skin:  Negative for color change and rash.  All other systems reviewed and are negative.   Physical Exam Triage Vital Signs ED Triage Vitals  Enc Vitals Group     BP      Pulse      Resp      Temp      Temp src      SpO2      Weight      Height      Head Circumference      Peak Flow      Pain Score      Pain Loc      Pain Edu?      Excl. in GC?    No data found.  Updated Vital Signs BP 133/84   Pulse (!) 105   Temp 99 F (37.2 C) (Oral)   Resp 16   Ht 5\' 5"  (1.651 m)   Wt 188 lb (85.3 kg)   LMP 06/09/2021 (Exact Date)   SpO2 98%    BMI 31.28 kg/m   Visual Acuity Right Eye Distance:   Left Eye Distance:   Bilateral Distance:    Right Eye Near:   Left Eye Near:    Bilateral Near:     Physical Exam Vitals and nursing note reviewed.  Constitutional:      General: She is not in acute distress.    Appearance: She is well-developed.  HENT:     Head: Normocephalic and atraumatic.     Right Ear: Tympanic membrane normal.     Left Ear: Tympanic membrane normal.     Nose: Nose normal.     Mouth/Throat:     Mouth: Mucous membranes are moist.     Pharynx: Posterior oropharyngeal erythema present.  Eyes:     Conjunctiva/sclera: Conjunctivae normal.  Cardiovascular:     Rate and Rhythm: Normal rate and regular rhythm.     Heart sounds: Normal heart sounds.  Pulmonary:     Effort: Pulmonary effort is normal. No respiratory distress.     Breath sounds: Normal breath sounds.  Abdominal:     Palpations: Abdomen is soft.     Tenderness: There is no abdominal tenderness.  Musculoskeletal:     Cervical back: Neck supple.  Skin:    General: Skin is warm and dry.     Findings: No rash.  Neurological:     General: No focal deficit present.     Mental Status: She is alert and oriented to person, place, and time.     Gait: Gait normal.  Psychiatric:        Mood and Affect: Mood normal.        Behavior: Behavior normal.     UC Treatments / Results  Labs (all labs ordered are listed, but only abnormal results are displayed) Labs Reviewed  NOVEL CORONAVIRUS, NAA  CULTURE, GROUP A STREP [facility])  POCT RAPID STREP A (OFFICE)    EKG   Radiology No results found.  Procedures Procedures (including critical care time)  Medications Ordered in UC Medications - No data to display  Initial Impression / Assessment and Plan / UC Course  I have reviewed the triage vital signs and the nursing notes.  Pertinent labs & imaging results that  were available during my care of the patient were reviewed by me and  considered in my medical decision making (see chart for details).   Viral illness, COVID test.  Rapid strep negative; culture pending.  COVID pending.  Instructed patient to self quarantine per CDC guidelines.  Discussed symptomatic treatment including Tylenol or ibuprofen, rest, hydration.  Instructed patient to follow up with PCP if symptoms are not improving.  Patient agrees to plan of care.    Final Clinical Impressions(s) / UC Diagnoses   Final diagnoses:  Encounter for screening for COVID-19  Viral illness     Discharge Instructions      Your rapid strep test is negative.  A throat culture is pending; we will call you if it is positive requiring treatment.    Your COVID test is pending.  You should self quarantine until the test result is back.    Take Tylenol or ibuprofen as needed for fever or discomfort.  Rest and keep yourself hydrated.    Follow-up with your primary care provider if your symptoms are not improving.         ED Prescriptions   None    PDMP not reviewed this encounter.   Mickie Bail, NP 06/17/21 1301

## 2021-06-19 LAB — NOVEL CORONAVIRUS, NAA: SARS-CoV-2, NAA: NOT DETECTED

## 2021-06-19 LAB — SARS-COV-2, NAA 2 DAY TAT

## 2021-06-20 LAB — CULTURE, GROUP A STREP (THRC)

## 2021-07-08 ENCOUNTER — Encounter: Payer: Self-pay | Admitting: Emergency Medicine

## 2021-07-08 ENCOUNTER — Other Ambulatory Visit: Payer: Self-pay

## 2021-07-08 ENCOUNTER — Emergency Department: Payer: Commercial Managed Care - PPO

## 2021-07-08 ENCOUNTER — Emergency Department
Admission: EM | Admit: 2021-07-08 | Discharge: 2021-07-08 | Disposition: A | Discharge status: Home / Self Care | Payer: Commercial Managed Care - PPO | Attending: Emergency Medicine | Admitting: Emergency Medicine

## 2021-07-08 DIAGNOSIS — Z20822 Contact with and (suspected) exposure to covid-19: Secondary | ICD-10-CM | POA: Insufficient documentation

## 2021-07-08 DIAGNOSIS — H73891 Other specified disorders of tympanic membrane, right ear: Secondary | ICD-10-CM | POA: Diagnosis not present

## 2021-07-08 DIAGNOSIS — R197 Diarrhea, unspecified: Secondary | ICD-10-CM | POA: Diagnosis present

## 2021-07-08 DIAGNOSIS — R7401 Elevation of levels of liver transaminase levels: Secondary | ICD-10-CM | POA: Insufficient documentation

## 2021-07-08 DIAGNOSIS — K529 Noninfective gastroenteritis and colitis, unspecified: Secondary | ICD-10-CM

## 2021-07-08 DIAGNOSIS — Z79899 Other long term (current) drug therapy: Secondary | ICD-10-CM | POA: Diagnosis not present

## 2021-07-08 DIAGNOSIS — E039 Hypothyroidism, unspecified: Secondary | ICD-10-CM | POA: Insufficient documentation

## 2021-07-08 DIAGNOSIS — R112 Nausea with vomiting, unspecified: Secondary | ICD-10-CM

## 2021-07-08 DIAGNOSIS — R748 Abnormal levels of other serum enzymes: Secondary | ICD-10-CM

## 2021-07-08 LAB — RESP PANEL BY RT-PCR (FLU A&B, COVID) ARPGX2
Influenza A by PCR: NEGATIVE
Influenza B by PCR: NEGATIVE
SARS Coronavirus 2 by RT PCR: NEGATIVE

## 2021-07-08 LAB — COMPREHENSIVE METABOLIC PANEL
ALT: 539 U/L — ABNORMAL HIGH (ref 0–44)
AST: 145 U/L — ABNORMAL HIGH (ref 15–41)
Albumin: 4 g/dL (ref 3.5–5.0)
Alkaline Phosphatase: 221 U/L — ABNORMAL HIGH (ref 38–126)
Anion gap: 8 (ref 5–15)
BUN: 14 mg/dL (ref 6–20)
CO2: 26 mmol/L (ref 22–32)
Calcium: 9.4 mg/dL (ref 8.9–10.3)
Chloride: 101 mmol/L (ref 98–111)
Creatinine, Ser: 0.88 mg/dL (ref 0.44–1.00)
GFR, Estimated: >60 mL/min (ref >60)
Glucose, Bld: 103 mg/dL — ABNORMAL HIGH (ref 70–99)
Potassium: 3.7 mmol/L (ref 3.5–5.1)
Sodium: 135 mmol/L (ref 135–145)
Total Bilirubin: 0.7 mg/dL (ref 0.3–1.2)
Total Protein: 7.4 g/dL (ref 6.5–8.1)

## 2021-07-08 LAB — URINALYSIS, COMPLETE (UACMP) WITH MICROSCOPIC
Bacteria, UA: NONE SEEN
Bilirubin Urine: NEGATIVE
Glucose, UA: NEGATIVE mg/dL
Ketones, ur: NEGATIVE mg/dL
Leukocytes,Ua: NEGATIVE
Nitrite: NEGATIVE
Protein, ur: NEGATIVE mg/dL
Specific Gravity, Urine: 1.008 (ref 1.005–1.030)
WBC, UA: NONE SEEN WBC/hpf (ref 0–5)
pH: 6 (ref 5.0–8.0)

## 2021-07-08 LAB — C DIFFICILE QUICK SCREEN W PCR REFLEX
C Diff antigen: NEGATIVE
C Diff interpretation: NOT DETECTED
C Diff toxin: NEGATIVE

## 2021-07-08 LAB — CBC
HCT: 42.3 % (ref 36.0–46.0)
Hemoglobin: 14.5 g/dL (ref 12.0–15.0)
MCH: 30.9 pg (ref 26.0–34.0)
MCHC: 34.3 g/dL (ref 30.0–36.0)
MCV: 90 fL (ref 80.0–100.0)
Platelets: 331 10*3/uL (ref 150–400)
RBC: 4.7 MIL/uL (ref 3.87–5.11)
RDW: 11.8 % (ref 11.5–15.5)
WBC: 6.1 10*3/uL (ref 4.0–10.5)
nRBC: 0 % (ref 0.0–0.2)

## 2021-07-08 LAB — LIPASE, BLOOD: Lipase: 28 U/L (ref 11–51)

## 2021-07-08 LAB — POC URINE PREG, ED: Preg Test, Ur: NEGATIVE

## 2021-07-08 MED ORDER — CEPHALEXIN 500 MG PO CAPS: 500.0000 mg | ORAL_CAPSULE | Freq: Three times a day (TID) | ORAL | 0 refills | Status: DC

## 2021-07-08 MED ORDER — IOHEXOL 350 MG/ML SOLN
80.0000 mL | Freq: Once | INTRAVENOUS | Status: AC | PRN
  Administered 2021-07-08: 80 mL via INTRAVENOUS
  Filled 2021-07-08: qty 80

## 2021-07-08 NOTE — ED Triage Notes (Signed)
Pt comes into the ED via POV c/o N/V/D x a couple weeks.  Pt has been seen at UC multiple times and COVID and flu have been negative.  Pt continues to get worse with new symptoms.  Pt now presents with right ear pain and intermittent blurred vision.  Pt in NAD at this time with even and unlabored respirations.

## 2021-07-08 NOTE — ED Provider Notes (Signed)
[facility] Emergency Department Provider Note  Time seen: 6:35 PM  I have reviewed the triage vital signs and the nursing notes.   HISTORY  Chief Complaint Emesis, Diarrhea, Otalgia, and Blurred Vision   HPI Alexandra [name] is a 40 y.o. female with a past medical history of hypothyroidism presents to the emergency department for multiple symptoms.  According to the patient over the past 1 month she has had frequent cough congestion, intermittent fevers, sore throat, nausea vomiting and diarrhea at times.  Patient states she has been seen by her doctor as well as the urgent care several times has had COVID and flu test that have been negative.  Is currently taking a course of Augmentin for possible ear infection and sinus infection.  Patient states she feels like her symptoms are not improving so she came to the emergency department for evaluation.  Patient denies any abdominal pain, denies any chest pain.  States she continues to have a sore throat at times continues to have headaches at times, states she had a subjective fever yesterday but none today.  Continues to have nausea vomiting diarrhea at times.  Patient denies any new medications besides the Augmentin, denies any new sexual partners, denies any blood transfusions, last international travel was in February to [place].  Past Medical History:  Diagnosis Date   Hypothyroidism    Miscarriage    Thyroid disease     Patient Active Problem List   Diagnosis Date Noted   Preeclampsia in postpartum period 06/09/2020   Postoperative state 06/03/2020   Gestational hypertension affecting first pregnancy 06/02/2020   Elevated BP without diagnosis of hypertension 05/22/2020    Past Surgical History:  Procedure Laterality Date   CESAREAN SECTION N/A 06/03/2020   Procedure: CESAREAN SECTION;  Surgeon: Feliberto Gottron, Ihor Austin, MD;  Location: [facility] ORS;  Service: Obstetrics;  Laterality: N/A;   Fertility Treatments       Prior to Admission medications   Medication Sig Start Date End Date Taking? Authorizing Provider  acetaminophen (TYLENOL) 500 MG tablet Take 2 tablets (1,000 mg total) by mouth every 6 (six) hours as needed for mild pain or moderate pain. 06/12/20   Genia Del, CNM  ibuprofen (ADVIL) 800 MG tablet Take 1 tablet (800 mg total) by mouth every 6 (six) hours as needed for mild pain, moderate pain or cramping. 06/12/20   Genia Del, CNM  labetalol (NORMODYNE) 200 MG tablet Take 1 tablet (200 mg total) by mouth 2 (two) times daily. Hold dose for BP less than 100/60 06/12/20   Genia Del, CNM  levothyroxine (SYNTHROID) 100 MCG tablet Take 100 mcg by mouth daily before breakfast.    [provider]  NIFEdipine (PROCARDIA XL/NIFEDICAL XL) 60 MG 24 hr tablet Take 1 tablet (60 mg total) by mouth daily. 06/12/20   Genia Del, CNM  Prenatal Vit-Fe Fumarate-FA (PRENATAL MULTIVITAMIN) TABS tablet Take 1 tablet by mouth daily at 12 noon.    [provider]  senna-docusate (SENOKOT-S) 8.6-50 MG tablet Take 2 tablets by mouth daily. Patient not taking: Reported on 06/09/2020 06/06/20   McVey, Prudencio Pair, CNM    No Known Allergies  Family History  Problem Relation Age of Onset   Hypertension Father     Social History Social History   Tobacco Use   Smoking status: Never   Smokeless tobacco: Never  Vaping Use   Vaping Use: Never used  Substance Use Topics   Alcohol use: Never   Drug use: Never  Review of Systems Constitutional: Intermittent fevers Eyes: Negative for visual complaints ENT: Sore throat intermittent x1 month Cardiovascular: Negative for chest pain. Respiratory: Negative for shortness of breath.  Positive cough x1 month Gastrointestinal: Negative for abdominal pain positive for intermittent nausea vomiting and diarrhea x1 month Genitourinary: Negative for urinary compaints.  Negative for discharge. Musculoskeletal: Negative for  musculoskeletal complaints Skin: Negative for skin complaints  Neurological: Intermittent headaches x1 month. All other ROS negative  ____________________________________________   PHYSICAL EXAM:  VITAL SIGNS: ED Triage Vitals [07/08/21 1348]  Enc Vitals Group     BP (!) 139/98     Pulse Rate 93     Resp 16     Temp 98.2 F (36.8 C)     Temp Source Oral     SpO2 100 %     Weight 187 lb 13.3 oz (85.2 kg)     Height 5\' 5"  (1.651 m)     Head Circumference      Peak Flow      Pain Score 2     Pain Loc      Pain Edu?      Excl. in GC?    Constitutional: Alert and oriented. Well appearing and in no distress. Eyes: Normal exam ENT      Head: Normocephalic and atraumatic.  Does have bulging of the right tympanic membrane but no erythema.      Mouth/Throat: Mucous membranes are moist.  No pharyngeal erythema or exudates.  No tonsillar hypertrophy. Cardiovascular: Normal rate, regular rhythm.  Respiratory: Normal respiratory effort without tachypnea nor retractions. Breath sounds are clear Gastrointestinal: Soft and nontender. No distention.   Musculoskeletal: Nontender with normal range of motion in all extremities.  Neurologic:  Normal speech and language. No gross focal neurologic deficits  Skin:  Skin is warm, dry and intact.  Psychiatric: Mood and affect are normal.   ____________________________________________   RADIOLOGY  IMPRESSION:  Cholelithiasis.  No other acute finding by ultrasound.   CT shows nonspecific enterocolitis  ____________________________________________   INITIAL IMPRESSION / ASSESSMENT AND PLAN / ED COURSE  Pertinent labs & imaging results that were available during my care of the patient were reviewed by me and considered in my medical decision making (see chart for details).   Patient presents emergency department for symptoms of intermittent nausea vomiting diarrhea cough congestion right ear pain sore throat headaches intermittent fevers  x1 month.  Patient states she feels like her symptoms or not improving.  Patient's work-up today shows fairly significant LFT elevation does have an occasional cough during examination.  Right upper quadrant ultrasound shows cholelithiasis but no other concerning findings no signs of cholecystitis.  Given the patient's elevated LFTs and prolonged symptoms we will also obtain a COVID/flu PCR, obtain CT scan of the abdomen/pelvis obtain a chest x-ray.  I have added on a hepatitis panel as well as mononucleosis test.  Patient does have bulging of the right tympanic membrane but no erythema and is currently on Augmentin.  Augmentin could possibly cause drug-induced liver injury.  We will discontinue the Augmentin and place the patient on Keflex.  Nonspecific enterocolitis on CT.  We will discontinue Augmentin and switch patient to Keflex.  Patient states she continues to have liquid/watery diarrhea.  Given the patient's significant symptoms with reported fever yesterday and continued diarrhea we will obtain a stool sample for C. difficile as well as pathogen/bio fire testing.  Patient will be discharged with PCP follow-up.  Did discuss having her liver  function test rechecked within the next 1 week.  Patient agreeable.  Disa Riedlinger was evaluated in Emergency Department on 07/08/2021 for the symptoms described in the history of present illness. She was evaluated in the context of the global COVID-19 pandemic, which necessitated consideration that the patient might be at risk for infection with the SARS-CoV-2 virus that causes COVID-19. Institutional protocols and algorithms that pertain to the evaluation of patients at risk for COVID-19 are in a state of rapid change based on information released by regulatory bodies including the CDC and federal and state organizations. These policies and algorithms were followed during the patient's care in the ED.  ____________________________________________   FINAL  CLINICAL IMPRESSION(S) / ED DIAGNOSES  Elevated LFTs Enterocolitis   Minna Antis, MD 07/08/21 2040

## 2021-07-08 NOTE — Discharge Instructions (Addendum)
You have been seen in the emergency department.  Your work-up today shows elevated liver function test.  Please discontinue use of your Augmentin and begin taking Keflex as prescribed.  Please follow-up with your doctor within the next 1 week for recheck/reevaluation as well as recheck of your liver function tests and to follow-up your pending hepatitis panel, mononucleosis panel and stool studies.  Return to the emergency department for any symptom personally concerning to yourself.

## 2021-07-09 LAB — HEPATITIS PANEL, ACUTE
HCV Ab: NONREACTIVE
Hep A IgM: NONREACTIVE
Hep B C IgM: NONREACTIVE
Hepatitis B Surface Ag: NONREACTIVE

## 2021-07-09 LAB — GASTROINTESTINAL PANEL BY PCR, STOOL (REPLACES STOOL CULTURE)

## 2021-12-10 ENCOUNTER — Other Ambulatory Visit: Payer: Self-pay | Admitting: Obstetrics and Gynecology

## 2021-12-10 DIAGNOSIS — Z1231 Encounter for screening mammogram for malignant neoplasm of breast: Secondary | ICD-10-CM

## 2022-01-17 ENCOUNTER — Other Ambulatory Visit: Payer: Self-pay

## 2022-01-17 ENCOUNTER — Ambulatory Visit
Admission: RE | Admit: 2022-01-17 | Discharge: 2022-01-17 | Disposition: A | Discharge status: Home / Self Care | Payer: Commercial Managed Care - PPO | Source: Ambulatory Visit | Attending: Obstetrics and Gynecology | Admitting: Obstetrics and Gynecology

## 2022-01-17 DIAGNOSIS — Z1231 Encounter for screening mammogram for malignant neoplasm of breast: Secondary | ICD-10-CM | POA: Insufficient documentation

## 2022-06-23 ENCOUNTER — Other Ambulatory Visit: Payer: Self-pay | Admitting: Physical Medicine & Rehabilitation

## 2022-06-23 DIAGNOSIS — M542 Cervicalgia: Secondary | ICD-10-CM

## 2022-06-23 DIAGNOSIS — G8929 Other chronic pain: Secondary | ICD-10-CM

## 2022-06-27 ENCOUNTER — Ambulatory Visit
Admission: RE | Admit: 2022-06-27 | Discharge: 2022-06-27 | Disposition: A | Discharge status: Home / Self Care | Payer: Commercial Managed Care - PPO | Source: Ambulatory Visit | Attending: Emergency Medicine | Admitting: Emergency Medicine

## 2022-06-27 VITALS — BP 142/87 | HR 100 | Temp 97.7°F | Resp 18

## 2022-06-27 DIAGNOSIS — Z113 Encounter for screening for infections with a predominantly sexual mode of transmission: Secondary | ICD-10-CM

## 2022-06-27 DIAGNOSIS — N898 Other specified noninflammatory disorders of vagina: Secondary | ICD-10-CM | POA: Diagnosis not present

## 2022-06-27 LAB — POCT URINALYSIS DIP (MANUAL ENTRY)
Bilirubin, UA: NEGATIVE
Blood, UA: NEGATIVE
Glucose, UA: NEGATIVE mg/dL
Ketones, POC UA: NEGATIVE mg/dL
Leukocytes, UA: NEGATIVE
Nitrite, UA: NEGATIVE
Protein Ur, POC: NEGATIVE mg/dL
Spec Grav, UA: 1.015 (ref 1.010–1.025)
Urobilinogen, UA: 0.2 E.U./dL
pH, UA: 6.5 (ref 5.0–8.0)

## 2022-06-27 LAB — POCT URINE PREGNANCY: Preg Test, Ur: NEGATIVE

## 2022-06-27 MED ORDER — FLUCONAZOLE 150 MG PO TABS: 150.0000 mg | ORAL_TABLET | Freq: Every day | ORAL | 0 refills | Status: DC

## 2022-06-27 NOTE — ED Provider Notes (Signed)
Renaldo Fiddler    CSN: [id number] Arrival date & time: 06/27/22  1507      History   Chief Complaint Chief Complaint  Patient presents with   Vaginal Itching    Entered by patient    HPI Alexandra [name] is a 41 y.o. female.  Patient presents with vaginal itching and vaginal irritation x4 days.  Treatment attempted with OTC Monistat.  She denies fever, chills, abdominal pain, dysuria, hematuria, nausea, vomiting, diarrhea, constipation, pelvic pain, or other symptoms.  The history is provided by the patient and medical records.    Past Medical History:  Diagnosis Date   Hypothyroidism    Miscarriage    Thyroid disease     Patient Active Problem List   Diagnosis Date Noted   Preeclampsia in postpartum period 06/09/2020   Postoperative state 06/03/2020   Gestational hypertension affecting first pregnancy 06/02/2020   Elevated BP without diagnosis of hypertension 05/22/2020    Past Surgical History:  Procedure Laterality Date   CESAREAN SECTION N/A 06/03/2020   Procedure: CESAREAN SECTION;  Surgeon: Feliberto Gottron, Ihor Austin, MD;  Location: [facility] ORS;  Service: Obstetrics;  Laterality: N/A;   Fertility Treatments      OB History     Gravida  3   Para  1   Term  1   Preterm      AB  2   Living  1      SAB  1   IAB      Ectopic      Multiple  0   Live Births  1            Home Medications    Prior to Admission medications   Medication Sig Start Date End Date Taking? Authorizing Provider  fluconazole (DIFLUCAN) 150 MG tablet Take 1 tablet (150 mg total) by mouth daily. Take one tablet today.  May repeat in 3 days. 06/27/22  Yes Mickie Bail, NP  acetaminophen (TYLENOL) 500 MG tablet Take 2 tablets (1,000 mg total) by mouth every 6 (six) hours as needed for mild pain or moderate pain. 06/12/20   Genia Del, CNM  cephALEXin (KEFLEX) 500 MG capsule Take 1 capsule (500 mg total) by mouth 3 (three) times daily. 07/08/21   Minna Antis, MD  ibuprofen (ADVIL) 800 MG tablet Take 1 tablet (800 mg total) by mouth every 6 (six) hours as needed for mild pain, moderate pain or cramping. 06/12/20   Genia Del, CNM  labetalol (NORMODYNE) 200 MG tablet Take 1 tablet (200 mg total) by mouth 2 (two) times daily. Hold dose for BP less than 100/60 06/12/20   Genia Del, CNM  levothyroxine (SYNTHROID) 100 MCG tablet Take 100 mcg by mouth daily before breakfast.    [provider]  NIFEdipine (PROCARDIA XL/NIFEDICAL XL) 60 MG 24 hr tablet Take 1 tablet (60 mg total) by mouth daily. 06/12/20   Genia Del, CNM  Prenatal Vit-Fe Fumarate-FA (PRENATAL MULTIVITAMIN) TABS tablet Take 1 tablet by mouth daily at 12 noon.    [provider]  senna-docusate (SENOKOT-S) 8.6-50 MG tablet Take 2 tablets by mouth daily. Patient not taking: Reported on 06/09/2020 06/06/20   McVey, Prudencio Pair, CNM    Family History Family History  Problem Relation Age of Onset   Hypertension Father     Social History Social History   Tobacco Use   Smoking status: Never   Smokeless tobacco: Never  Vaping Use   Vaping Use: Never used  Substance  Use Topics   Alcohol use: Never   Drug use: Never     Allergies   Patient has no known allergies.   Review of Systems Review of Systems  Constitutional:  Negative for chills and fever.  Gastrointestinal:  Negative for abdominal pain, constipation, diarrhea, nausea and vomiting.  Genitourinary:  Positive for vaginal pain. Negative for dysuria, flank pain, hematuria, pelvic pain and vaginal discharge.  Skin:  Negative for color change and rash.  All other systems reviewed and are negative.    Physical Exam Triage Vital Signs ED Triage Vitals  Enc Vitals Group     BP      Pulse      Resp      Temp      Temp src      SpO2      Weight      Height      Head Circumference      Peak Flow      Pain Score      Pain Loc      Pain Edu?      Excl. in GC?    No data  found.  Updated Vital Signs BP (!) 142/87   Pulse 100   Temp 97.7 F (36.5 C)   Resp 18   LMP 05/27/2022   SpO2 98%   Visual Acuity Right Eye Distance:   Left Eye Distance:   Bilateral Distance:    Right Eye Near:   Left Eye Near:    Bilateral Near:     Physical Exam Vitals and nursing note reviewed.  Constitutional:      General: She is not in acute distress.    Appearance: She is well-developed. She is not ill-appearing.  HENT:     Mouth/Throat:     Mouth: Mucous membranes are moist.  Cardiovascular:     Rate and Rhythm: Normal rate and regular rhythm.     Heart sounds: Normal heart sounds.  Pulmonary:     Effort: Pulmonary effort is normal. No respiratory distress.     Breath sounds: Normal breath sounds.  Abdominal:     General: Bowel sounds are normal.     Palpations: Abdomen is soft.     Tenderness: There is no abdominal tenderness. There is no right CVA tenderness, left CVA tenderness, guarding or rebound.  Musculoskeletal:     Cervical back: Neck supple.  Skin:    General: Skin is warm and dry.  Neurological:     Mental Status: She is alert.  Psychiatric:        Mood and Affect: Mood normal.        Behavior: Behavior normal.      UC Treatments / Results  Labs (all labs ordered are listed, but only abnormal results are displayed) Labs Reviewed  POCT URINALYSIS DIP (MANUAL ENTRY)  POCT URINE PREGNANCY  CERVICOVAGINAL ANCILLARY ONLY    EKG   Radiology No results found.  Procedures Procedures (including critical care time)  Medications Ordered in UC Medications - No data to display  Initial Impression / Assessment and Plan / UC Course  I have reviewed the triage vital signs and the nursing notes.  Pertinent labs & imaging results that were available during my care of the patient were reviewed by me and considered in my medical decision making (see chart for details).  Vaginal itching, vaginal irritation, STD screening.  Patient obtained  vaginal self swab for testing.  Treating with Diflucan.  Discussed that we will  call if test results are positive.  Discussed that she may require treatment at that time.  Discussed that sexual partner(s) may also require treatment.  Instructed patient to abstain from sexual activity for at least 7 days.  Instructed her to follow-up with her PCP or gynecologist if her symptoms are not improving.  Patient agrees to plan of care.    Final Clinical Impressions(s) / UC Diagnoses   Final diagnoses:  Vaginal itching  Vaginal irritation  Screening for STD (sexually transmitted disease)     Discharge Instructions      Take the Diflucan as directed.    Your vaginal tests are pending.  If your test results are positive, we will call you.  You and your sexual partner(s) may require treatment at that time.  Do not have sexual activity for at least 7 days.    Follow up with your primary care provider or gynecologist if your symptoms are not improving.        ED Prescriptions     Medication Sig Dispense Auth. Provider   fluconazole (DIFLUCAN) 150 MG tablet Take 1 tablet (150 mg total) by mouth daily. Take one tablet today.  May repeat in 3 days. 2 tablet Mickie Bail, NP      PDMP not reviewed this encounter.   Mickie Bail, NP 06/27/22 [phone]

## 2022-06-27 NOTE — Discharge Instructions (Addendum)
Take the Diflucan as directed.   Your vaginal tests are pending.  If your test results are positive, we will call you.  You and your sexual partner(s) may require treatment at that time.  Do not have sexual activity for at least 7 days.    Follow up with your primary care provider or gynecologist if your symptoms are not improving.      

## 2022-06-27 NOTE — ED Triage Notes (Signed)
Patient presents to Urgent Care with complaints of vaginal itching since Monday. Treated with OTC monistat.

## 2022-06-30 ENCOUNTER — Ambulatory Visit
Admission: RE | Admit: 2022-06-30 | Discharge: 2022-06-30 | Disposition: A | Discharge status: Home / Self Care | Payer: Commercial Managed Care - PPO | Source: Ambulatory Visit | Attending: Physical Medicine & Rehabilitation | Admitting: Physical Medicine & Rehabilitation

## 2022-06-30 DIAGNOSIS — G8929 Other chronic pain: Secondary | ICD-10-CM | POA: Diagnosis present

## 2022-06-30 DIAGNOSIS — M542 Cervicalgia: Secondary | ICD-10-CM | POA: Diagnosis present

## 2022-06-30 DIAGNOSIS — M546 Pain in thoracic spine: Secondary | ICD-10-CM | POA: Insufficient documentation

## 2022-06-30 LAB — CERVICOVAGINAL ANCILLARY ONLY
Bacterial Vaginitis (gardnerella): NEGATIVE
Candida Glabrata: NEGATIVE
Candida Vaginitis: POSITIVE — AB
Chlamydia: NEGATIVE
Comment: NEGATIVE
Comment: NEGATIVE
Comment: NEGATIVE
Comment: NEGATIVE
Comment: NEGATIVE
Comment: NORMAL
Neisseria Gonorrhea: NEGATIVE
Trichomonas: NEGATIVE

## 2022-07-03 IMAGING — MG MM DIGITAL SCREENING BILAT W/ TOMO AND CAD
8 series · 9 of 24 positions shown · non-contrast
Comparison: None.

CLINICAL DATA: Screening.

EXAM:
DIGITAL SCREENING BILATERAL MAMMOGRAM WITH TOMOSYNTHESIS AND CAD
TECHNIQUE: Bilateral screening digital craniocaudal and mediolateral oblique
mammograms were obtained. Bilateral screening digital breast
tomosynthesis was performed. The images were evaluated with
computer-aided detection.

[L CC synth-2D]
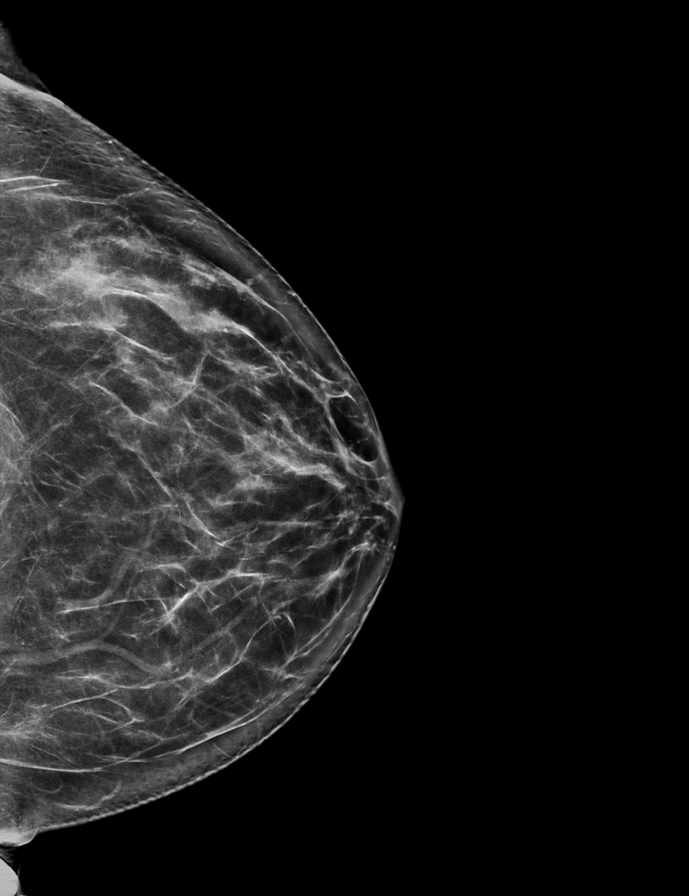

[R MLO synth-2D]
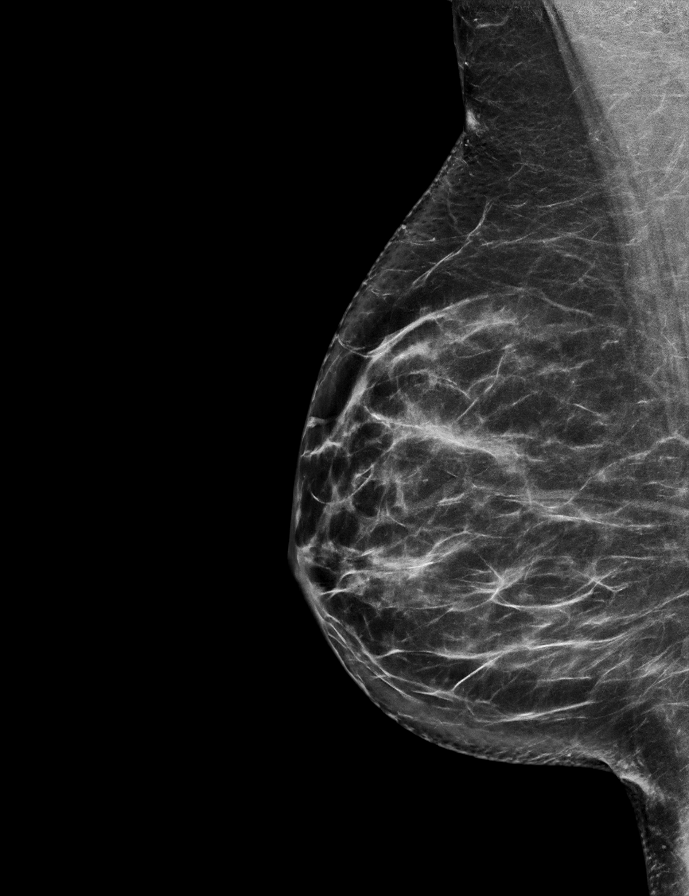

[R CC synth-2D]
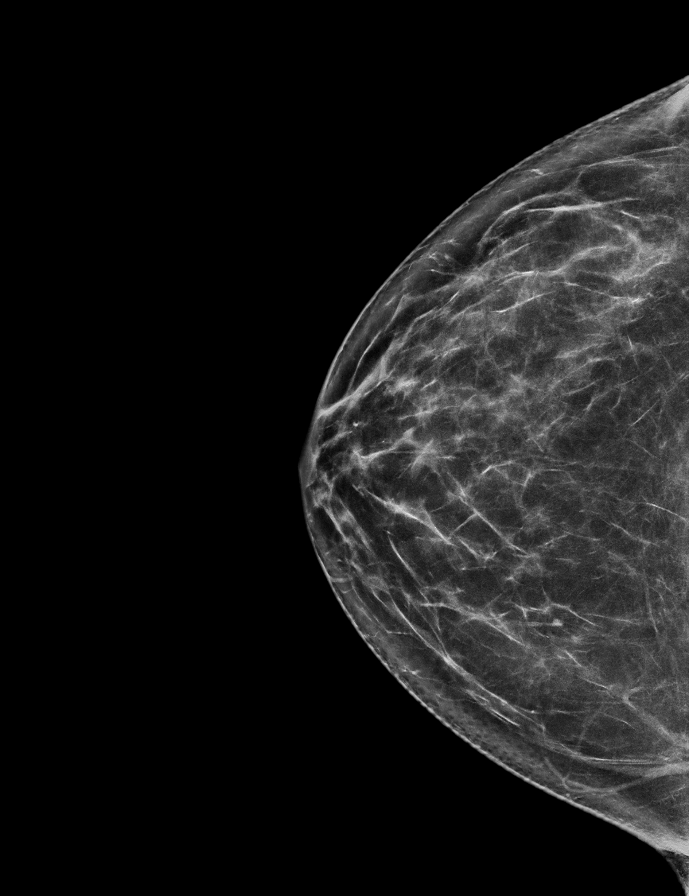

[L MLO synth-2D]
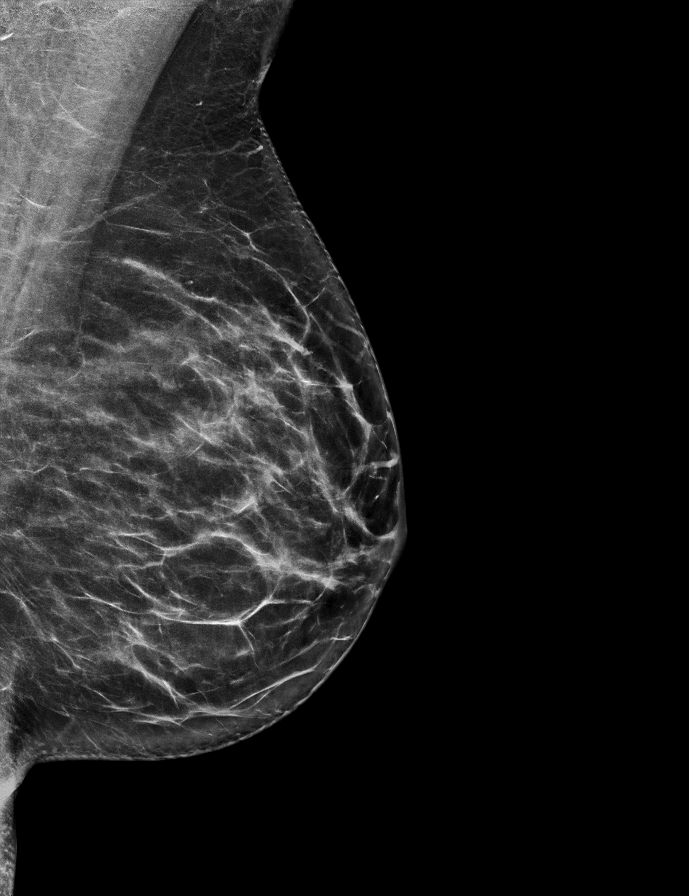

[R MLO tomo · 2 of 77 frames shown]
[frame 25/77]
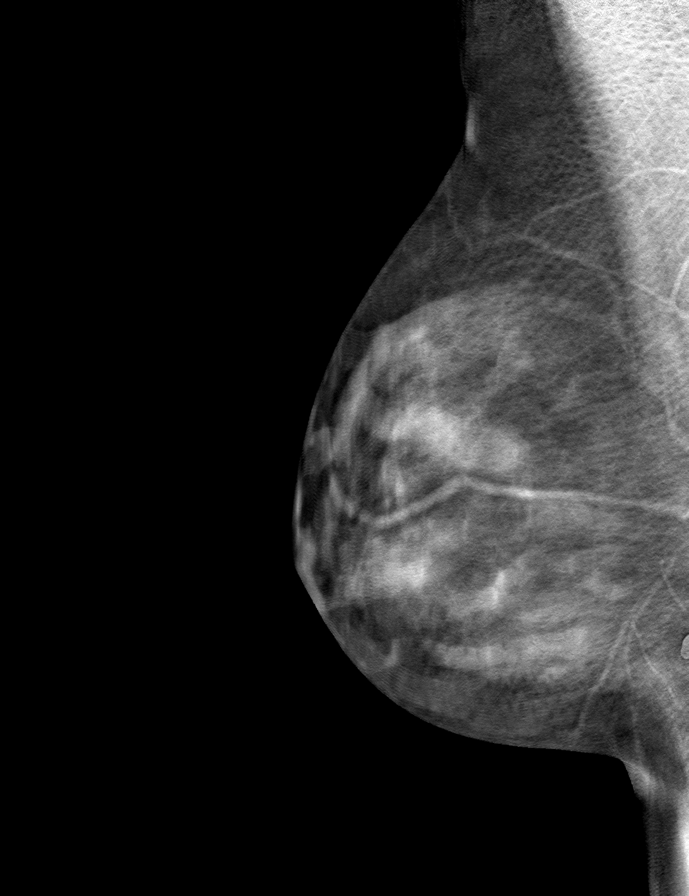
[frame 39/77]
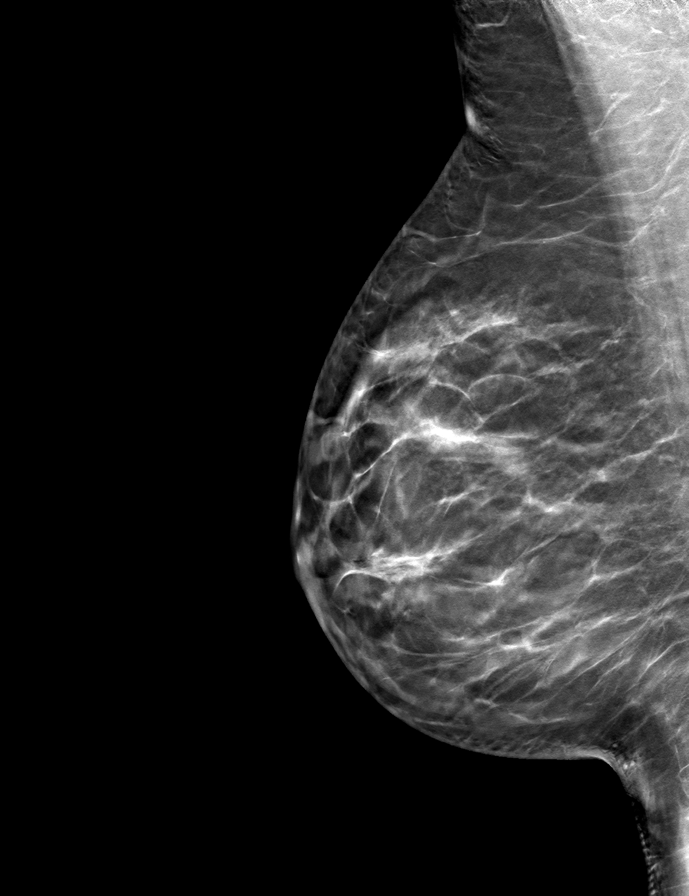

[L MLO tomo · tomo slice 37/74.0]
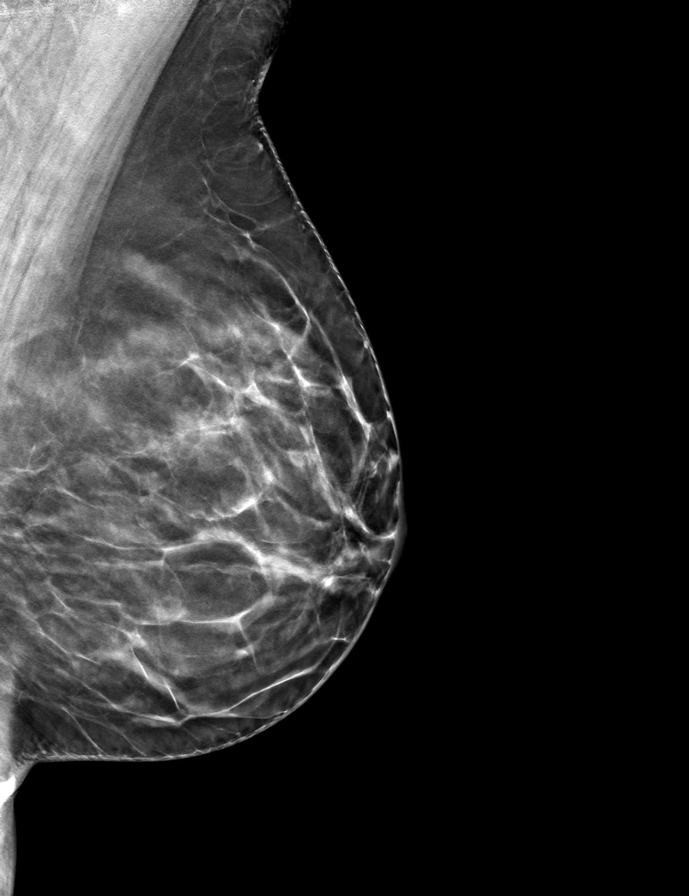

[L CC tomo · tomo slice 37/74.0]
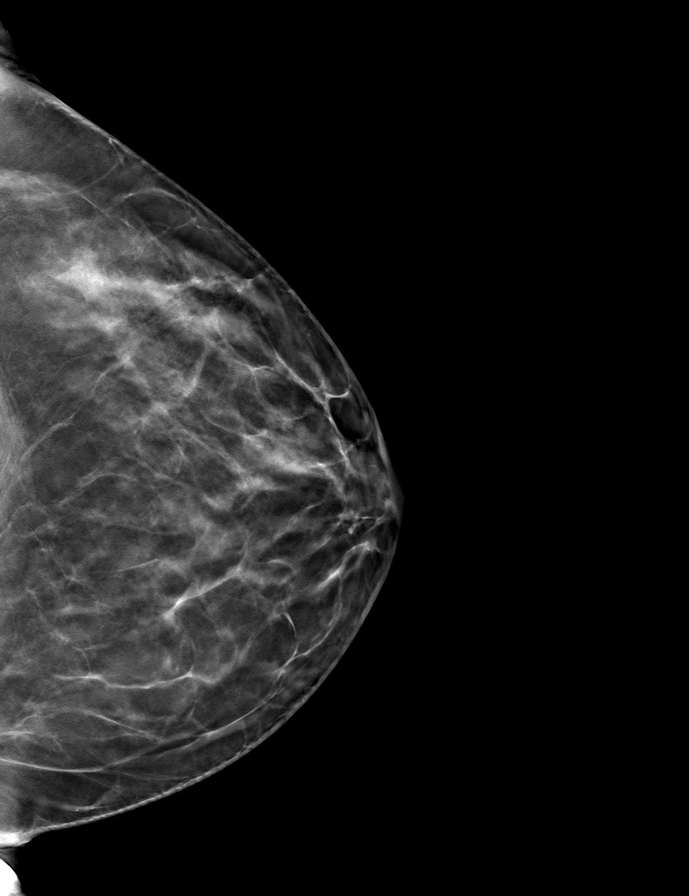

[R CC tomo · tomo slice 36/71.0]
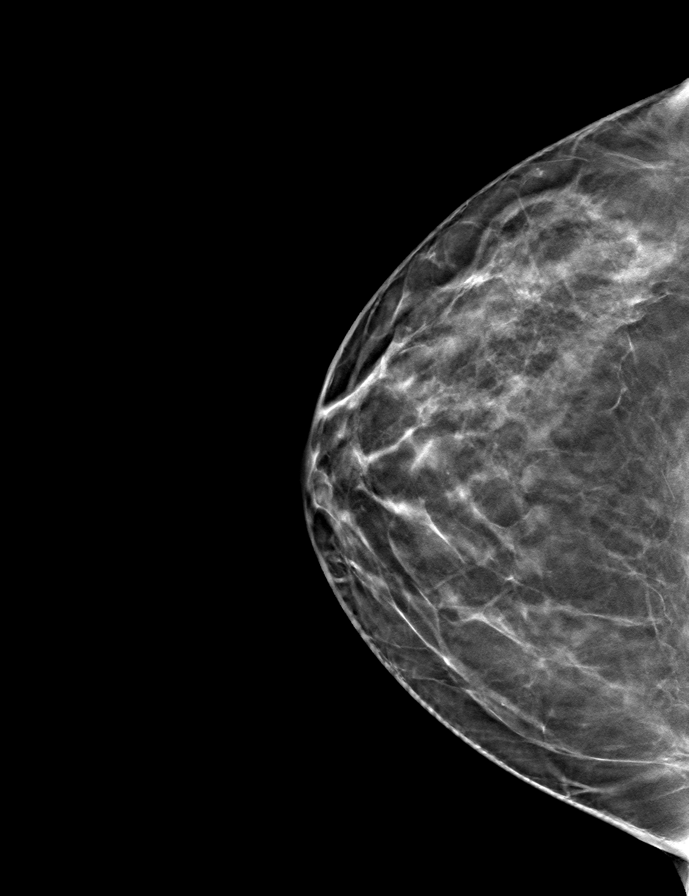

[9 of 24 positions shown; findings below may reference images not displayed]

ACR Breast Density Category b: There are scattered areas of
fibroglandular density.
FINDINGS: There are no findings suspicious for malignancy.
IMPRESSION: No mammographic evidence of malignancy. A result letter of this
screening mammogram will be mailed directly to the patient.

RECOMMENDATION:
Screening mammogram at age 42. (Code:X1-R-031)

BI-RADS CATEGORY  1: Negative.

## 2022-10-05 ENCOUNTER — Ambulatory Visit
Admission: EM | Admit: 2022-10-05 | Discharge: 2022-10-05 | Disposition: A | Discharge status: Home / Self Care | Payer: Commercial Managed Care - PPO | Attending: Emergency Medicine | Admitting: Emergency Medicine

## 2022-10-05 DIAGNOSIS — N898 Other specified noninflammatory disorders of vagina: Secondary | ICD-10-CM | POA: Diagnosis present

## 2022-10-05 DIAGNOSIS — R030 Elevated blood-pressure reading, without diagnosis of hypertension: Secondary | ICD-10-CM

## 2022-10-05 LAB — POCT URINALYSIS DIP (MANUAL ENTRY)
Bilirubin, UA: NEGATIVE
Blood, UA: NEGATIVE
Glucose, UA: NEGATIVE mg/dL
Ketones, POC UA: NEGATIVE mg/dL
Leukocytes, UA: NEGATIVE
Nitrite, UA: NEGATIVE
Protein Ur, POC: NEGATIVE mg/dL
Spec Grav, UA: >=1.03 — AB (ref 1.010–1.025)
Urobilinogen, UA: 0.2 E.U./dL
pH, UA: 5.5 (ref 5.0–8.0)

## 2022-10-05 MED ORDER — FLUCONAZOLE 150 MG PO TABS: 150.0000 mg | ORAL_TABLET | Freq: Every day | ORAL | 0 refills | Status: DC

## 2022-10-05 NOTE — ED Triage Notes (Signed)
Patient to Urgent Care with complaints of heavy vaginal discharge and vaginal itching. Reports she finished a 14 day course of antibiotics for endometritis. States she typically gets a yeast infection after abx. Denies any urinary symptoms.

## 2022-10-05 NOTE — ED Provider Notes (Signed)
[facility]    CSN: [id number] Arrival date & time: 10/05/22  1352      History   Chief Complaint Chief Complaint  Patient presents with   Vaginal Itching    Yeast infection after 14 days of antibiotics - Entered by patient    HPI Alexandra [name] is a 41 y.o. female.  Patient presents with 4-day history of white vaginal discharge and itching.  She states this is similar to previous episodes of yeast infection.  She recently completed a course of antibiotics for endometritis.  She denies fever, chills, abdominal pain, dysuria, hematuria, pelvic pain, or other symptoms.  No treatments at home.  The history is provided by the patient and medical records.    Past Medical History:  Diagnosis Date   Hypothyroidism    Miscarriage    Thyroid disease     Patient Active Problem List   Diagnosis Date Noted   Preeclampsia in postpartum period 06/09/2020   Postoperative state 06/03/2020   Gestational hypertension affecting first pregnancy 06/02/2020   Elevated BP without diagnosis of hypertension 05/22/2020    Past Surgical History:  Procedure Laterality Date   CESAREAN SECTION N/A 06/03/2020   Procedure: CESAREAN SECTION;  Surgeon: Ouida Sills, Gwen Her, MD;  Location: [facility] ORS;  Service: Obstetrics;  Laterality: N/A;   Fertility Treatments      OB History     Gravida  3   Para  1   Term  1   Preterm      AB  2   Living  1      SAB  1   IAB      Ectopic      Multiple  0   Live Births  1            Home Medications    Prior to Admission medications   Medication Sig Start Date End Date Taking? Authorizing Provider  fluconazole (DIFLUCAN) 150 MG tablet Take 1 tablet (150 mg total) by mouth daily. Take one tablet today.  May repeat in 3 days. 10/05/22  Yes Sharion Balloon, NP  acetaminophen (TYLENOL) 500 MG tablet Take 2 tablets (1,000 mg total) by mouth every 6 (six) hours as needed for mild pain or moderate pain. 06/12/20   Lisette Grinder, CNM  cephALEXin (KEFLEX) 500 MG capsule Take 1 capsule (500 mg total) by mouth 3 (three) times daily. 07/08/21   Harvest Dark, MD  ibuprofen (ADVIL) 800 MG tablet Take 1 tablet (800 mg total) by mouth every 6 (six) hours as needed for mild pain, moderate pain or cramping. 06/12/20   Lisette Grinder, CNM  labetalol (NORMODYNE) 200 MG tablet Take 1 tablet (200 mg total) by mouth 2 (two) times daily. Hold dose for BP less than 100/60 06/12/20   Lisette Grinder, CNM  levothyroxine (SYNTHROID) 100 MCG tablet Take 100 mcg by mouth daily before breakfast.    [provider]  NIFEdipine (PROCARDIA XL/NIFEDICAL XL) 60 MG 24 hr tablet Take 1 tablet (60 mg total) by mouth daily. 06/12/20   Lisette Grinder, CNM  Prenatal Vit-Fe Fumarate-FA (PRENATAL MULTIVITAMIN) TABS tablet Take 1 tablet by mouth daily at 12 noon.    [provider]  senna-docusate (SENOKOT-S) 8.6-50 MG tablet Take 2 tablets by mouth daily. Patient not taking: Reported on 06/09/2020 06/06/20   McVey, Murray Hodgkins, CNM    Family History Family History  Problem Relation Age of Onset   Hypertension Father     Social History Social  History   Tobacco Use   Smoking status: Never   Smokeless tobacco: Never  Vaping Use   Vaping Use: Never used  Substance Use Topics   Alcohol use: Never   Drug use: Never     Allergies   Patient has no known allergies.   Review of Systems Review of Systems  Constitutional:  Negative for chills and fever.  Gastrointestinal:  Negative for abdominal pain, diarrhea and vomiting.  Genitourinary:  Positive for vaginal discharge. Negative for dysuria, flank pain, hematuria and pelvic pain.  Skin:  Negative for rash.  All other systems reviewed and are negative.    Physical Exam Triage Vital Signs ED Triage Vitals  Enc Vitals Group     BP      Pulse      Resp      Temp      Temp src      SpO2      Weight      Height      Head Circumference      Peak Flow       Pain Score      Pain Loc      Pain Edu?      Excl. in [place]?    No data found.  Updated Vital Signs BP (!) 137/98   Pulse 74   Temp 98.8 F (37.1 C)   Resp 18   Ht 5\' 5"  (1.651 m)   Wt 194 lb (88 kg)   LMP 09/21/2022   SpO2 98%   BMI 32.28 kg/m   Visual Acuity Right Eye Distance:   Left Eye Distance:   Bilateral Distance:    Right Eye Near:   Left Eye Near:    Bilateral Near:     Physical Exam Vitals and nursing note reviewed.  Constitutional:      General: She is not in acute distress.    Appearance: Normal appearance. She is well-developed. She is not ill-appearing.  HENT:     Mouth/Throat:     Mouth: Mucous membranes are moist.  Cardiovascular:     Rate and Rhythm: Normal rate and regular rhythm.     Heart sounds: Normal heart sounds.  Pulmonary:     Effort: Pulmonary effort is normal. No respiratory distress.     Breath sounds: Normal breath sounds.  Abdominal:     General: Bowel sounds are normal.     Palpations: Abdomen is soft.     Tenderness: There is no abdominal tenderness. There is no right CVA tenderness, left CVA tenderness, guarding or rebound.  Musculoskeletal:     Cervical back: Neck supple.  Skin:    General: Skin is warm and dry.  Neurological:     Mental Status: She is alert.  Psychiatric:        Mood and Affect: Mood normal.        Behavior: Behavior normal.      UC Treatments / Results  Labs (all labs ordered are listed, but only abnormal results are displayed) Labs Reviewed  POCT URINALYSIS DIP (MANUAL ENTRY) - Abnormal; Notable for the following components:      Result Value   Spec Grav, UA >=1.030 (*)    All other components within normal limits  CERVICOVAGINAL ANCILLARY ONLY    EKG   Radiology No results found.  Procedures Procedures (including critical care time)  Medications Ordered in UC Medications - No data to display  Initial Impression / Assessment and Plan / UC Course  I have reviewed the triage  vital signs and the nursing notes.  Pertinent labs & imaging results that were available during my care of the patient were reviewed by me and considered in my medical decision making (see chart for details).   Vaginal discharge, vaginal itching, elevated blood pressure. Patient obtained vaginal self swab for testing.  Treating with Diflucan.  Discussed that we will call if test results are positive.  Instructed patient to abstain from sexual activity for at least 7 days.  Instructed her to follow-up with her PCP or gynecologist if her symptoms are not improving.  Also discussed with patient that her blood pressure is elevated today and needs to be rechecked by PCP in 2 to 4 weeks.  Patient agrees to plan of care.    Final Clinical Impressions(s) / UC Diagnoses   Final diagnoses:  Vaginal discharge  Vaginal itching  Elevated blood pressure reading     Discharge Instructions      Take the Diflucan as directed.    Your vaginal tests are pending.  If your test results are positive, we will call you.  Do not have sexual activity for at least 7 days.    Follow up with your primary care provider if your symptoms are not improving.    Your blood pressure is elevated today at 137/98; recheck 130/92.  Please have this rechecked by your primary care provider in 2-4 weeks.          ED Prescriptions     Medication Sig Dispense Auth. Provider   fluconazole (DIFLUCAN) 150 MG tablet Take 1 tablet (150 mg total) by mouth daily. Take one tablet today.  May repeat in 3 days. 2 tablet Sharion Balloon, NP      PDMP not reviewed this encounter.   Sharion Balloon, NP 10/05/22 1440

## 2022-10-05 NOTE — Discharge Instructions (Addendum)
Take the Diflucan as directed.    Your vaginal tests are pending.  If your test results are positive, we will call you.  Do not have sexual activity for at least 7 days.    Follow up with your primary care provider if your symptoms are not improving.    Your blood pressure is elevated today at 137/98; recheck 130/92.  Please have this rechecked by your primary care provider in 2-4 weeks.

## 2022-10-06 LAB — CERVICOVAGINAL ANCILLARY ONLY
Bacterial Vaginitis (gardnerella): NEGATIVE
Candida Glabrata: NEGATIVE
Candida Vaginitis: POSITIVE — AB
Chlamydia: NEGATIVE
Comment: NEGATIVE
Comment: NEGATIVE
Comment: NEGATIVE
Comment: NEGATIVE
Comment: NEGATIVE
Comment: NORMAL
Neisseria Gonorrhea: NEGATIVE
Trichomonas: NEGATIVE

## 2023-09-14 DIAGNOSIS — O0991 Supervision of high risk pregnancy, unspecified, first trimester: Secondary | ICD-10-CM | POA: Insufficient documentation

## 2023-09-15 LAB — OB RESULTS CONSOLE RUBELLA ANTIBODY, IGM: Rubella: IMMUNE

## 2023-09-15 LAB — OB RESULTS CONSOLE VARICELLA ZOSTER ANTIBODY, IGG: Varicella: IMMUNE

## 2023-09-15 LAB — OB RESULTS CONSOLE GC/CHLAMYDIA
Chlamydia: NEGATIVE
Neisseria Gonorrhea: NEGATIVE

## 2023-09-15 LAB — OB RESULTS CONSOLE HEPATITIS B SURFACE ANTIGEN: Hepatitis B Surface Ag: NEGATIVE

## 2023-11-26 ENCOUNTER — Encounter: Payer: Self-pay | Admitting: Certified Nurse Midwife

## 2023-11-30 ENCOUNTER — Ambulatory Visit: Payer: Self-pay | Admitting: Maternal & Fetal Medicine

## 2023-11-30 ENCOUNTER — Ambulatory Visit: Payer: Commercial Managed Care - PPO | Attending: Certified Nurse Midwife | Admitting: Obstetrics and Gynecology

## 2023-11-30 DIAGNOSIS — O281 Abnormal biochemical finding on antenatal screening of mother: Secondary | ICD-10-CM

## 2023-11-30 DIAGNOSIS — O3512X0 Maternal care for (suspected) chromosomal abnormality in fetus, trisomy 18, not applicable or unspecified: Secondary | ICD-10-CM | POA: Diagnosis not present

## 2023-11-30 DIAGNOSIS — O09522 Supervision of elderly multigravida, second trimester: Secondary | ICD-10-CM | POA: Diagnosis not present

## 2023-11-30 DIAGNOSIS — O09812 Supervision of pregnancy resulting from assisted reproductive technology, second trimester: Secondary | ICD-10-CM

## 2023-11-30 DIAGNOSIS — Z3A20 20 weeks gestation of pregnancy: Secondary | ICD-10-CM

## 2023-11-30 NOTE — Progress Notes (Signed)
Virtual Visit via Video Note  I connected with Alexandra [name] on 11/30/23 at  1:00 PM EST by a video enabled telemedicine application and verified that I am speaking with the correct person using two identifiers.  Location: Patient: work Provider: [facility] [place]   Referring Provider: [facility] Length of Consultation: 40 minutes  Ms. Barkdull was referred to [facility] for genetic counseling because of an increased risk for Down syndrome by the maternal serum prenatal screen. She is also of advanced maternal age (42 years old) in this pregnancy, which was achieved through IVF. The patient was present on this virtual visit with her husband, Alexandra [name].  Abnormal Maternal Serum Screening: We provided background information on the maternal serum prenatal screen.  It was explained that this is a maternal blood test performed between the 14th and 21st week of pregnancy which measures several pregnancy proteins.  The levels of these proteins combined with the age related chance for chromosome conditions can help determine if a pregnancy is at high risk for certain birth defects or problems.  However, it cannot diagnose or rule out these defects.  An abnormal maternal serum screen does not necessarily mean that the baby has a problem.  Maternal serum screening can identify approximately 80% of neural tube defects, up to 75% of Down syndrome cases and 60% of trisomy 18 cases.  The neural tube consists of the fetal head and spine and if this structure fails to close during development, spina bifida (open spine) or anencephaly (open skull) could result.  Chromosomes are the inherited structures that contain our instructions for development (genes).  Each cell in our body normally has 46 chromosomes.  Rarely, when an egg and sperm unite, an extra or missing chromosome can be passed on to the baby by mistake.  These types of chromosome problems typically cause mental  retardation and might also cause birth defects.  We discussed Down syndrome (an extra chromosome 21) and trisomy 30 (an extra chromosome 18).    The maternal serum screen results showed an increased the chance of Down syndrome (Trisomy 21) in the pregnancy.  Before maternal serum screening, the age-related chance of Down syndrome in the pregnancy was 1 in 4.  Given the maternal serum screen results, the chance is now estimated to be 1 in 154.  This means that the chance the baby does not have Down sydnrome is greater than 99 percent. Though this is an abnormal or increased risk result, it is important to recognize that the age related chance is significantly reduced. The chance for Maternal Serum Screening to be abnormal in women above the age of 49 is estimated to be 30%. The chance for open spina bifida was within normal range, with a 1 in 4399 chance in this pregnancy.   Preimplantation Genetic Testing for Aneuploidies (PGT-A): Ms. Ginsberg reported that they had PGT-A performed on the embryos and selected a normal female embryo for implantation. PGT-A is the process of screening IVF embryos for chromosomal aneuploidies prior to transfer. Today, PGT-A is usually performed via next generation sequencing (NGS) on trophectoderm cells that eventually develop into extraembryonic material such as the placenta. Embryos may be found to be euploid, aneuploid, or mosaic on PGT-A. While this testing is highly accurate, it cannot rule out all chromosome conditions in a pregnancy and therefore prenatal testing is still available.  We do not have a copy of these results to comment on the findings for this pregnancy,  but are happy to review those results if desired. We made the follow available to help provide reassurance and additional information for this pregnancy:  Targeted ultrasound uses high frequency sound waves to create an image of the developing fetus.  An ultrasound is often recommended as a routine means of  evaluating the pregnancy.  It is also used to screen for fetal anatomy problems (for example, a heart defect) that might be suggestive of a chromosomal or other abnormality.    Amniocentesis involves the removal of a small amount of amniotic fluid from the sac surrounding the fetus with the use of a thin needle inserted through the maternal abdomen and uterus.  Ultrasound guidance is used throughout the procedure.  Fetal cells are directly evaluated and > 98% of neural tube defects can be detected.  The main risks to this procedure include complications leading to miscarriage in less than 1 in 200 cases (0.5%).    Cell free fetal DNA testing from maternal blood can be utilized to determine whether or not the baby may have Down syndrome, trisomy 77, trisomy 43 or sex chromosome aneuploidies.  This test utilizes a maternal blood sample and DNA sequencing technology to isolate circulating cell free fetal DNA from maternal plasma.  The fetal DNA can then be analyzed for DNA sequences that are derived from the three most common chromosomes involved in aneuploidy, chromosomes 13, 18, and 21.  If the overall amount of DNA is greater than the expected level for any of these chromosomes, aneuploidy is suspected.  While we do not consider it a replacement for invasive testing and karyotype analysis, a negative result from this testing would be reassuring, though not a guarantee of a normal chromosome complement for the baby.  An abnormal result is certainly suggestive of an abnormal chromosome complement, though we would still recommend CVS or amniocentesis to confirm any findings from this testing.  Carrier screening:  Litia stated that carrier screening was performed previously as part of her initial evaluations through reproductive endocrinology and those results were all negative.  She declined record review of those results.    Family and Pregnancy history: We obtained a detailed family history and pregnancy  history.  This is the third pregnancy for Mr and Mrs. Mahajan following numerous failed IVF attempts.  The pregnancy journey has been very long for them and they are thrilled about this pregnancy. Both stated that results from genetic testing would not alter their plans for the pregnancy.  They have a healthy 78 year old son and experienced one first trimester miscarriage. She reported no complications and no exposure to medications, alcohol, tobacco or recreational drugs in this pregnancy.  The remainder of the family history is unremarkable for birth defects, developmental delays, recurrent pregnancy loss or known chromosome abnormalities.  Plan of care: Sadina declined amniocentesis for chromosome conditions. She would like to continue with her anatomy ultrasound at [facility] next week as well as the fetal echocardiogram scheduled due to IVF. If those evaluations are normal, she indicated that she will likely be comfortable with those results and decline additional testing. If she is still concerned or if the ultrasounds show abnormalities, we are happy to plan for a detailed anatomy ultrasound at [facility] (or her OB can schedule this through [facility]) or for her to have cell free DNA testing (either MaterniT21 through her OB or Panorama testing through our clinic). Postnatal genetic testing is also available if clinically indicated.  The  patient was encouraged to call with questions or concerns. We can be contacted at [phone].  Cherly Anderson, MS, CGC   I provided 40 minutes of non-face-to-face time during this encounter.   Katrina Stack

## 2024-01-01 NOTE — Progress Notes (Signed)
I provided general supervision for this patient and was immediately available for any patient care concerns. See GC note on 11/30/23.

## 2024-01-22 LAB — OB RESULTS CONSOLE HIV ANTIBODY (ROUTINE TESTING): HIV: NONREACTIVE

## 2024-02-16 DIAGNOSIS — Z98891 History of uterine scar from previous surgery: Secondary | ICD-10-CM | POA: Insufficient documentation

## 2024-02-16 DIAGNOSIS — O26643 Intrahepatic cholestasis of pregnancy, third trimester: Secondary | ICD-10-CM | POA: Insufficient documentation

## 2024-02-16 DIAGNOSIS — O99210 Obesity complicating pregnancy, unspecified trimester: Secondary | ICD-10-CM | POA: Insufficient documentation

## 2024-02-16 DIAGNOSIS — O09523 Supervision of elderly multigravida, third trimester: Secondary | ICD-10-CM | POA: Insufficient documentation

## 2024-03-01 ENCOUNTER — Other Ambulatory Visit: Payer: Self-pay

## 2024-03-01 ENCOUNTER — Encounter: Payer: Self-pay | Admitting: Obstetrics and Gynecology

## 2024-03-01 ENCOUNTER — Observation Stay
Admission: EM | Admit: 2024-03-01 | Discharge: 2024-03-01 | Disposition: A | Discharge status: Home / Self Care | Attending: Certified Nurse Midwife | Admitting: Certified Nurse Midwife

## 2024-03-01 ENCOUNTER — Observation Stay

## 2024-03-01 DIAGNOSIS — Z3A33 33 weeks gestation of pregnancy: Secondary | ICD-10-CM | POA: Diagnosis not present

## 2024-03-01 DIAGNOSIS — O09523 Supervision of elderly multigravida, third trimester: Secondary | ICD-10-CM | POA: Diagnosis not present

## 2024-03-01 DIAGNOSIS — Z7982 Long term (current) use of aspirin: Secondary | ICD-10-CM | POA: Diagnosis not present

## 2024-03-01 DIAGNOSIS — O26643 Intrahepatic cholestasis of pregnancy, third trimester: Secondary | ICD-10-CM | POA: Diagnosis not present

## 2024-03-01 DIAGNOSIS — O0993 Supervision of high risk pregnancy, unspecified, third trimester: Secondary | ICD-10-CM | POA: Insufficient documentation

## 2024-03-01 DIAGNOSIS — E039 Hypothyroidism, unspecified: Secondary | ICD-10-CM | POA: Insufficient documentation

## 2024-03-01 DIAGNOSIS — O99283 Endocrine, nutritional and metabolic diseases complicating pregnancy, third trimester: Secondary | ICD-10-CM | POA: Diagnosis not present

## 2024-03-01 DIAGNOSIS — O99213 Obesity complicating pregnancy, third trimester: Secondary | ICD-10-CM | POA: Diagnosis not present

## 2024-03-01 DIAGNOSIS — O288 Other abnormal findings on antenatal screening of mother: Principal | ICD-10-CM | POA: Diagnosis present

## 2024-03-01 DIAGNOSIS — K831 Obstruction of bile duct: Secondary | ICD-10-CM | POA: Diagnosis not present

## 2024-03-01 DIAGNOSIS — Z79899 Other long term (current) drug therapy: Secondary | ICD-10-CM | POA: Insufficient documentation

## 2024-03-01 DIAGNOSIS — O368330 Maternal care for abnormalities of the fetal heart rate or rhythm, third trimester, not applicable or unspecified: Principal | ICD-10-CM | POA: Insufficient documentation

## 2024-03-01 DIAGNOSIS — E669 Obesity, unspecified: Secondary | ICD-10-CM | POA: Insufficient documentation

## 2024-03-01 MED ORDER — DOCUSATE SODIUM 100 MG PO CAPS: 100.0000 mg | ORAL_CAPSULE | Freq: Every day | ORAL | Status: DC

## 2024-03-01 MED ORDER — ZOLPIDEM TARTRATE 5 MG PO TABS
5.0000 mg | ORAL_TABLET | Freq: Every evening | ORAL | Status: DC | PRN
Start: 2024-03-01 — End: 2024-03-01

## 2024-03-01 MED ORDER — ACETAMINOPHEN 325 MG PO TABS: 650.0000 mg | ORAL_TABLET | ORAL | Status: DC | PRN

## 2024-03-01 MED ORDER — PRENATAL MULTIVITAMIN CH: 1.0000 | ORAL_TABLET | Freq: Every day | ORAL | Status: DC

## 2024-03-01 MED ORDER — CALCIUM CARBONATE ANTACID 500 MG PO CHEW: 2.0000 | CHEWABLE_TABLET | ORAL | Status: DC | PRN

## 2024-03-01 NOTE — Discharge Summary (Signed)
 Alexandra [name] is a 43 y.o. female. She is at [redacted]w[redacted]d gestation. No LMP recorded. Patient is pregnant. Estimated Date of Delivery: 04/14/24  Prenatal care site: [facility]   Current pregnancy complicated by:  - cholestasis of pregnancy - pregnancy achieved through IVF - obesity - AMA - thyroid disease - history of cesarean section - history of postpartum anxiety - abnormal AFP, embryo was tested prior to implantation and was WNL  Chief complaint: sent over from the office for non-reactive NST.  S: Resting comfortably. no CTX, no VB.no LOF,  Active fetal movement.  Denies: HA, visual changes, SOB, or RUQ/epigastric pain  Maternal Medical History:   Past Medical History:  Diagnosis Date   Hypothyroidism    Miscarriage    Thyroid disease     Past Surgical History:  Procedure Laterality Date   CESAREAN SECTION N/A 06/03/2020   Procedure: CESAREAN SECTION;  Surgeon: Schermerhorn, Ihor Austin, MD;  Location: [facility] ORS;  Service: Obstetrics;  Laterality: N/A;   Fertility Treatments      No Known Allergies  Prior to Admission medications   Medication Sig Start Date End Date Taking? Authorizing Provider  aspirin 81 MG chewable tablet Chew 81 mg by mouth daily.   Yes [provider]  levothyroxine (SYNTHROID) 100 MCG tablet Take 100 mcg by mouth daily before breakfast.   Yes [provider]  Prenatal Vit-Fe Fumarate-FA (PRENATAL MULTIVITAMIN) TABS tablet Take 1 tablet by mouth daily at 12 noon.   Yes [provider]  ursodiol (ACTIGALL) 250 MG tablet Take 300 mg by mouth 3 (three) times daily.   Yes [provider]  acetaminophen (TYLENOL) 500 MG tablet Take 2 tablets (1,000 mg total) by mouth every 6 (six) hours as needed for mild pain or moderate pain. 06/12/20   Genia Del, CNM  cephALEXin (KEFLEX) 500 MG capsule Take 1 capsule (500 mg total) by mouth 3 (three) times daily. 07/08/21   Minna Antis, MD  fluconazole (DIFLUCAN)  150 MG tablet Take 1 tablet (150 mg total) by mouth daily. Take one tablet today.  May repeat in 3 days. 10/05/22   Mickie Bail, NP  ibuprofen (ADVIL) 800 MG tablet Take 1 tablet (800 mg total) by mouth every 6 (six) hours as needed for mild pain, moderate pain or cramping. 06/12/20   Genia Del, CNM  labetalol (NORMODYNE) 200 MG tablet Take 1 tablet (200 mg total) by mouth 2 (two) times daily. Hold dose for BP less than 100/60 06/12/20   Genia Del, CNM  NIFEdipine (PROCARDIA XL/NIFEDICAL XL) 60 MG 24 hr tablet Take 1 tablet (60 mg total) by mouth daily. 06/12/20   Genia Del, CNM  senna-docusate (SENOKOT-S) 8.6-50 MG tablet Take 2 tablets by mouth daily. Patient not taking: Reported on 06/09/2020 06/06/20   McVey, Prudencio Pair, CNM      Social History: She  reports that she has never smoked. She has never used smokeless tobacco. She reports that she does not drink alcohol and does not use drugs.  Family History: family history includes Hypertension in her father.  no history of gyn cancers  Review of Systems: A full review of systems was performed and negative except as noted in the HPI.     O:  BP 128/73 (BP Location: Left Arm)   Pulse 65   Temp 98 F (36.7 C) (Oral)   Resp 16   Ht 5\' 5"  (1.651 m)   Wt 97.1 kg   BMI 35.61 kg/m  No results  found for this or any previous visit (from the past 48 hours).   Constitutional: NAD, AAOx3  HE/ENT: extraocular movements grossly intact, moist mucous membranes CV: RRR PULM: nl respiratory effort, CTABL     Abd: gravid, non-tender, non-distended, soft      Ext: Non-tender, Nonedematous   Psych: mood appropriate, speech normal Pelvic: deferred  Fetal  monitoring: Cat 1 Appropriate for GA Baseline: 140-145bpm Variability: moderate Accelerations: present x >2 Decelerations absent Time 3 hours  A/P: 43 y.o. [redacted]w[redacted]d here for antenatal surveillance for nonreactive NST in the office  Principle Diagnosis:  Reactive  NST  Labor: not present.  Fetal Wellbeing: Reassuring Cat 1 tracing for 3 hours. AFI WNL, 18.3cm Reactive NST  D/c home stable, precautions reviewed, follow-up as scheduled.    Alexandra [name], CNM 03/01/2024 1:52 PM

## 2024-03-01 NOTE — Progress Notes (Signed)
 Pt transported to [place]

## 2024-03-01 NOTE — OB Triage Note (Signed)
 Patient is a 43 yo, G4P1, at 33 weeks 5 days. Patient presents to L&D after being sent over from [facility] for a non reactive NST.  Patient denies any vaginal bleeding or LOF. Patient denies any regular or consistent contractions. Patient reports +FM. Monitors applied and assessing. VSS. Initial fetal heart tone 155. Wilson CNM aware of patients arrival to unit. Plan to place in observation for fetal monitoring.

## 2024-03-01 NOTE — Progress Notes (Signed)
 Discharge instructions provided to patient. Patient verbalized understanding. Pt educated on signs and symptoms of labor, vaginal bleeding, LOF, and fetal movement. Red flag signs reviewed by RN. Patient discharged home with significant other in stable condition.

## 2024-03-07 ENCOUNTER — Other Ambulatory Visit: Payer: Self-pay | Admitting: Obstetrics and Gynecology

## 2024-03-07 DIAGNOSIS — Z01818 Encounter for other preprocedural examination: Secondary | ICD-10-CM

## 2024-03-07 NOTE — H&P (Signed)
 Alexandra [name] is a 43 y.o. female presenting for elective repeat LTCS on 03/24/24 [facility] 04/14/24 + cholestasis of pregnancy  43 y.o. [id number] Patient's LMP 07/09/23. Consistent with ultrasound 09/11/23 @ 8w 4d. Estimated Date of Delivery: 04/14/24. Sex of baby and name:  Girl " "   Partner:    Melida Gimenez Factors complicating this pregnancy  Conceived with IVF Taking estrogen and progesterone for another 2 weeks.  Embryo was genetic tested and WNL.  Fetal echocardiogram - completed 12/14/2023 - normal.  Antepartum management: Monthly growth [place] in 3rd trimester (multiple risk factors) NST/AFI ? - start between 32-36 weeks depending on risk factors and BP concerns Cholestasis of pregnancy Referral to MFM Recommendations: Diagnosis: Serum bile acids:46.9 Aminotransferase: AST: 21   ALT:25 (01/28/24) Serum bile acids: 9.1  AST: 13 ALT: 16  (02/08/24) Serum bile acids: 6.8 AST: 11 ALT: 7   (02/19/24) Serum bile acids: 7.4            AST: 11            ALT: 6    (03/01/2024)  Draw weekly if normal Treatment: Ursodeoxycholic Acid (UDCA) 300 mg TID until delivery Twice weekly NST with AFI beginning at time diagnosis or 28 weeks whichever comes first - started on 02/01/24 Delivery When the highest total bile acid concentration during pregnancy is <40, suggested delivery at 37+0 to 38+6 weeks When the highest total bile acid concentration during pregnancy is 40 to 99, suggested delivery at 36+0 to 37+0 weeks When the highest total bile acid concentration during pregnancy is >=100, suggested delivery at or before 36+0 weeks Delivery scheduled for:  Obesity - NOB BMI 33 P/C Ratio: 48 AST: 14 ALT: 15 AMA Age at delivery: 43 years old  Normal genetic testing with embryo Thyroid disease Taking Synthroid daily Seeing [facility] endocrinology History of pre-eclampsia, requiring readmission to the hospital NOB Labs - CMP: wnl P/C ratio:48 Start 81mg  aspirin daily at 12wks Elevated BP without diagnosis of  HTN Typically 130s-140s/90s-100, has had several in normal range since becoming pregnant 09/15/23: Provided with BP log and educated to check her BP twice daily and send BP readings in 1wk via MyChart. If consistently >135/85, will start Procardia 30mg  XL.  09/24/23: home Bps are normal, no elevated BP 12/23/23 - 142/90 -> 134/82 - will check BP's at home, discussed likely dx of chronic HTN d/t several elevated BP's in first and early 2nd trimester  Previous cesarean section Due to failure to progress (5cm with adequate contraction x 13hrs) Operative records available in Mychart Desires a repeat c-section with TJS and scheduled on 03/24/24 MFM consult if anterior placenta History of postpartum anxiety Saw a therapist which helped Will need 2wk postpartum mood check Abnormal AFP Increased risk for Trisomy 21 MFM consult completed 12/16 - risk of Trisomy 21 1 in 154 and greater than 99% chance that baby does NOT have Trisomy 21 Normal anatomy US  Declined amniocentesis  Screening results and needs: NOB:  Medicaid Questionnaire:  []  ACHD Program Depression Score: 5 MBT: O Positive      Ab screen: Neg   HIV: Neg      RPR: N/R    Hep B: Neg    Hep C: N/R Pap: 07/17/20-NILM HPV NEG  G/C: Neg/Neg Rubella: Immune       VZV: Immune TSH: 0.922    HgA1C: 5.0 Aneuploidy:  First trimester:  MaternitT21: Declined (embryo was tested prior to transfer) CF/SMA carrier screening: Prior to IVF Second trimester (  AFP/tetra): POS - increased risk for Trisomy 21 28 weeks:  Review Medicaid Questionnaire: []  ACHD Program Depression Score:1 Blood consent:signed 01/22/2024 Hgb: 12.8  Platelets:  215  Glucola:  103 Rhogam: n/a  HIV:  Negative RPR: NR 36 weeks:  GBS:   G/C:   Hgb:  Platelets:      Last [place]:  08/21/23: Single IUP, with a CRL of 2.20 mm, consistent with 5w 5d. 08/28/23: Single IUP, with cardiac activity and CRL of 7.95 mm, consistent with 6w 5d.  09/11/23: Single IUP, with cardiac activity, and  CRL 20.04 mm, consistent with 8w 4d.  12/22/23: variable presentation, ant plac, EFW-759 g @92 %, AFI-WNL 01/22/24: transverse, ant plac, EFW- 1428 g @ 88%, AFI-23.3 cm 02/08/24: cephalic, ant plac, no evidence of previa, AFI- 18 cm 02/19/24: Single IUP, EFW 4lb 13oz (2,184g) 78%, AC 79%, AFI 20.9 cm, Cephalic presentation, Anterior placenta 02/23/24: SIUP FHT=160 BPM. Pres=Ceph Plac=Anterior. AFI=Normal 20.3 cm.  Immunization:   Flu in season - Given 10/13/23 DLC Tdap at 27-36 weeks - given [facility] 02/03/24 RSV at 32-36 weeks - out of season Contraception Plan: Already does not have fallopian tubes  Feeding Plan: Breastfeeding Labor Plans: desires a repeat c-section with TJS.        OB History     Gravida  4   Para  1   Term  1   Preterm      AB  2   Living  1      SAB  1   IAB      Ectopic      Multiple  0   Live Births  1          Past Medical History:  Diagnosis Date   Hypothyroidism    Miscarriage    Thyroid disease    Past Surgical History:  Procedure Laterality Date   CESAREAN SECTION N/A 06/03/2020   Procedure: CESAREAN SECTION;  Surgeon: Lakyn Mantione, Ihor Austin, MD;  Location: [facility] ORS;  Service: Obstetrics;  Laterality: N/A;   Fertility Treatments     Family History: family history includes Hypertension in her father. Social History:  reports that she has never smoked. She has never used smokeless tobacco. She reports that she does not drink alcohol and does not use drugs.      Review of Systems History   currently breastfeeding. Exam 126/88 Physical Exam  Lungs CTA   CV RRR  Abd gravid Prenatal labs: ABO, Rh:  O+ Antibody:  neg Rubella:  Imm , Vz Imm  RPR:   NR  HBsAg:   neg , hep c N/R HIV:   neg  GBS:   pending   Assessment/Plan: Elective Repeat LTCS at 37+0 weeks due to cholestasis of pregnancy . Pt has had previous bilateral salpingectomy    The risks of cesarean section discussed with the patient included but were not limited to:  bleeding which may require transfusion or reoperation; infection which may require antibiotics; injury to bowel, bladder, ureters or other surrounding organs; injury to the fetus; need for additional procedures including hysterectomy in the event of a life-threatening hemorrhage; placental abnormalities wth subsequent pregnancies, incisional problems, thromboembolic phenomenon and other postoperative/anesthesia complications. The patient concurred with the proposed plan, giving informed written consent for the procedure.   . Anesthesia and OR aware. Preoperative prophylactic antibiotics and SCDs ordered on call to the OR.  To OR when ready./s   Ihor Austin Omah Dewalt 03/07/2024, 1:59 PM

## 2024-03-15 LAB — OB RESULTS CONSOLE GBS: GBS: NEGATIVE

## 2024-03-16 ENCOUNTER — Other Ambulatory Visit

## 2024-03-16 LAB — OB RESULTS CONSOLE GBS: GBS: NEGATIVE

## 2024-03-17 ENCOUNTER — Observation Stay: Admission: EM | Admit: 2024-03-17 | Discharge: 2024-03-17 | Disposition: A | Discharge status: Home / Self Care

## 2024-03-17 ENCOUNTER — Other Ambulatory Visit: Payer: Self-pay

## 2024-03-17 ENCOUNTER — Encounter: Payer: Self-pay | Admitting: Obstetrics and Gynecology

## 2024-03-17 DIAGNOSIS — Z3A36 36 weeks gestation of pregnancy: Secondary | ICD-10-CM | POA: Diagnosis not present

## 2024-03-17 DIAGNOSIS — O133 Gestational [pregnancy-induced] hypertension without significant proteinuria, third trimester: Principal | ICD-10-CM | POA: Insufficient documentation

## 2024-03-17 DIAGNOSIS — O139 Gestational [pregnancy-induced] hypertension without significant proteinuria, unspecified trimester: Secondary | ICD-10-CM | POA: Diagnosis present

## 2024-03-17 DIAGNOSIS — Z7901 Long term (current) use of anticoagulants: Secondary | ICD-10-CM | POA: Insufficient documentation

## 2024-03-17 DIAGNOSIS — R030 Elevated blood-pressure reading, without diagnosis of hypertension: Secondary | ICD-10-CM | POA: Diagnosis present

## 2024-03-17 DIAGNOSIS — O163 Unspecified maternal hypertension, third trimester: Principal | ICD-10-CM | POA: Diagnosis present

## 2024-03-17 LAB — COMPREHENSIVE METABOLIC PANEL WITH GFR
ALT: 9 U/L (ref 0–44)
AST: 15 U/L (ref 15–41)
Albumin: 3 g/dL — ABNORMAL LOW (ref 3.5–5.0)
Alkaline Phosphatase: 94 U/L (ref 38–126)
Anion gap: 9 (ref 5–15)
BUN: 12 mg/dL (ref 6–20)
CO2: 21 mmol/L — ABNORMAL LOW (ref 22–32)
Calcium: 9.2 mg/dL (ref 8.9–10.3)
Chloride: 106 mmol/L (ref 98–111)
Creatinine, Ser: 0.62 mg/dL (ref 0.44–1.00)
GFR, Estimated: >60 mL/min (ref >60)
Glucose, Bld: 91 mg/dL (ref 70–99)
Potassium: 3.7 mmol/L (ref 3.5–5.1)
Sodium: 136 mmol/L (ref 135–145)
Total Bilirubin: 0.5 mg/dL (ref 0.0–1.2)
Total Protein: 6.1 g/dL — ABNORMAL LOW (ref 6.5–8.1)

## 2024-03-17 LAB — CBC
HCT: 34.3 % — ABNORMAL LOW (ref 36.0–46.0)
Hemoglobin: 11.7 g/dL — ABNORMAL LOW (ref 12.0–15.0)
MCH: 31 pg (ref 26.0–34.0)
MCHC: 34.1 g/dL (ref 30.0–36.0)
MCV: 90.7 fL (ref 80.0–100.0)
Platelets: 239 10*3/uL (ref 150–400)
RBC: 3.78 MIL/uL — ABNORMAL LOW (ref 3.87–5.11)
RDW: 12.9 % (ref 11.5–15.5)
WBC: 7.2 10*3/uL (ref 4.0–10.5)
nRBC: 0 % (ref 0.0–0.2)

## 2024-03-17 LAB — PROTEIN / CREATININE RATIO, URINE
Creatinine, Urine: 192 mg/dL
Protein Creatinine Ratio: 0.04 mg/mg{creat} (ref 0.00–0.15)
Total Protein, Urine: 7 mg/dL

## 2024-03-17 MED ORDER — ACETAMINOPHEN 500 MG PO TABS: 1000.0000 mg | ORAL_TABLET | Freq: Four times a day (QID) | ORAL | Status: DC | PRN

## 2024-03-17 MED ORDER — CALCIUM CARBONATE ANTACID 500 MG PO CHEW: 2.0000 | CHEWABLE_TABLET | ORAL | Status: DC | PRN

## 2024-03-17 NOTE — Discharge Instructions (Signed)
 For blood pressure monitoring at home: Please check your blood pressure at home daily and write down what it is so that you can review it with your provider  Make sure you are able to sit for 15 minutes prior to taking it  If blood pressure 160/110 or greater repeat in 15 minutes.  If still 160/110 or greater come to Labor and Delivery for evaluation.  If it goes down please contact the on call provider to check in.   Please go to Labor and Delivery for severe range blood pressures (160/110 or higher), moderate to severe headache that is not relieved with Tylenol, chest pain, shortness of breath, changes in vision, or persistent pain in upper right abdomen.   PRETERM LABOR: Includes any of the following symptoms that occur between 20-[redacted] weeks gestation. If these symptoms are not stopped, preterm labor can result in preterm delivery, placing your baby at risk.  Notify your doctor if any of the following occur: 1. Menstrual-like cramps   5. Pelvic pressure  2. Uterine contractions. These may be painless and feel like the uterus is tightening or the baby is "balling up" 6. Increase or change in vaginal discharge  3. Low, dull backache, unrelieved by heat or Tylenol  7. Vaginal bleeding  4. Intestinal cramps, with our without diarrhea, sometimes 8. A general feeling that "something is not right"   9. Leaking of fluid described as "gas pain"

## 2024-03-17 NOTE — OB Triage Note (Signed)
 Pt discharged home per order.  Pt stable and ambulatory and an After Visit Summary was printed and given to the patient. Discharge education completed with patient including follow up instructions, appointments, and medication list. Pt received labor and PIH precautions. Patient able to verbalize understanding, all questions fully answered upon discharge. Patient instructed to return to ED, call 911, or call MD for any changes in condition.

## 2024-03-17 NOTE — Progress Notes (Signed)
 Pt G4P1 [redacted]w[redacted]d presents to L/D triage from home after noting elevated blood pressures. She also reports a headache, rated 3/10, that is ongoing despite hydration and rest. Pt declines tylenol at this time.  Pt has no vision changes, clonus, or atypical edema. +2 reflexes noted.  Pt denies bleeding, LOF, contractions, and pain.  Pt reports positive fetal movement.  Initial FHT 170. Monitors applied and assessing.  VSS. Initial BP 138/91. Cycling q15 min. CNM aware of patient's arrival. Labs pending.

## 2024-03-17 NOTE — Discharge Summary (Signed)
 Alexandra [name] is a 43 y.o. female. She is at [redacted]w[redacted]d gestation. No LMP recorded. Patient is pregnant. 04/14/2024, by Patient Reported   Prenatal care site: [facility]  Chief complaint: elevated blood pressure at home   Admission Diagnoses:  1) intrauterine pregnancy at [redacted]w[redacted]d  2) Elevated blood pressure complicating pregnancy in third trimester, antepartum [O16.3]  Discharge Diagnoses:  Principal Problem:   Elevated blood pressure complicating pregnancy in third trimester, antepartum Active Problems:   Gestational hypertension   HPI: Alexandra [name] presents to L&D with complaints of elevated blood pressures at home. States blood pressures were 140-150/90. She also reported having a mild headache (3/10). This improved with rest and hydration.  Denies changes in vision or RUQ pain. Her pregnancy is complicated by cholestasis of pregnancy, AMA, previous cesarean delivery, and pregnancy achieved with IVF .  She denies Contractions, Loss of fluid, or Vaginal bleeding. Endorses fetal movement as active.   S: Resting comfortably. no CTX, no VB.no LOF,  Active fetal movement. Headache has improved with minimal interventions, denies visual changes, or RUQ pain.   Maternal Medical History:  Past Medical Hx:  has a past medical history of Hypothyroidism, Miscarriage, and Thyroid disease.    Past Surgical Hx:  has a past surgical history that includes Fertility Treatments and Cesarean section (N/A, 06/03/2020).   No Known Allergies   Prior to Admission medications   Medication Sig Start Date End Date Taking? Authorizing Provider  aspirin 81 MG chewable tablet Chew 81 mg by mouth daily.   Yes [provider]  levothyroxine (SYNTHROID) 100 MCG tablet Take 100 mcg by mouth daily before breakfast.   Yes [provider]  Prenatal Vit-Fe Fumarate-FA (PRENATAL MULTIVITAMIN) TABS tablet Take 1 tablet by mouth daily.   Yes [provider]  ursodiol (ACTIGALL) 300 MG capsule  Take 300 mg by mouth 3 (three) times daily.   Yes [provider]    Social History: She  reports that she has never smoked. She has never used smokeless tobacco. She reports that she does not drink alcohol and does not use drugs.  Family History: family history includes Hypertension in her father.   Review of Systems: A full review of systems was performed and negative except as noted in the HPI.     Pertinent Results:   O:  BP (!) 132/92   Pulse 96   Temp 98.1 F (36.7 C) (Oral)   Ht 5\' 5"  (1.651 m)   Wt 97.5 kg   BMI 35.78 kg/m  Results for orders placed or performed during the hospital encounter of 03/17/24 (from the past 48 hours)  Comprehensive metabolic panel   Collection Time: 03/17/24  5:01 PM  Result Value Ref Range   Sodium 136 135 - 145 mmol/L   Potassium 3.7 3.5 - 5.1 mmol/L   Chloride 106 98 - 111 mmol/L   CO2 21 (L) 22 - 32 mmol/L   Glucose, Bld 91 70 - 99 mg/dL   BUN 12 6 - 20 mg/dL   Creatinine, Ser 5.36 0.44 - 1.00 mg/dL   Calcium 9.2 8.9 - 64.4 mg/dL   Total Protein 6.1 (L) 6.5 - 8.1 g/dL   Albumin 3.0 (L) 3.5 - 5.0 g/dL   AST 15 15 - 41 U/L   ALT 9 0 - 44 U/L   Alkaline Phosphatase 94 38 - 126 U/L   Total Bilirubin 0.5 0.0 - 1.2 mg/dL   GFR, Estimated >03 >47 mL/min   Anion gap 9 5 -  15  CBC   Collection Time: 03/17/24  5:01 PM  Result Value Ref Range   WBC 7.2 4.0 - 10.5 K/uL   RBC 3.78 (L) 3.87 - 5.11 MIL/uL   Hemoglobin 11.7 (L) 12.0 - 15.0 g/dL   HCT 29.5 (L) 62.1 - 30.8 %   MCV 90.7 80.0 - 100.0 fL   MCH 31.0 26.0 - 34.0 pg   MCHC 34.1 30.0 - 36.0 g/dL   RDW 65.7 84.6 - 96.2 %   Platelets 239 150 - 400 K/uL   nRBC 0.0 0.0 - 0.2 %  Protein / creatinine ratio, urine   Collection Time: 03/17/24  5:05 PM  Result Value Ref Range   Creatinine, Urine 192 mg/dL   Total Protein, Urine 7 mg/dL   Protein Creatinine Ratio 0.04 0.00 - 0.15 mg/mg[Cre]     Constitutional: NAD, AAOx3  PULM: nl respiratory effort Abd: gravid,  non-tender Ext: Non-tender, Nonedmeatous Psych: mood appropriate, speech normal Pelvic : deferred   NST: Baseline FHR: 140 beats/min Variability: moderate Accelerations: present Decelerations: absent Tocometry: Occasional, mild contraction  Time: at least 20 minutes   Interpretation: Category I INDICATIONS: gestational HTN, AMA, cholestasis RESULTS:  A NST procedure was performed with FHR monitoring and a normal baseline established, appropriate time of 20-40 minutes of evaluation, and accels >2 seen w 15x15 characteristics.  Results show a REACTIVE NST.   Consults: None  Procedures: NST  Hospital Course: The patient was admitted to Labor and Delivery Triage for observation. Preeclampsia labs were normal. Blood pressures were normal to mild range with no severe ranges noted. She remained asymptomatic. Discussed diagnosis of gestational hypertension, antepartum monitoring, and recommendations for delivery. She is currently has NST's scheduled twice weekly and a planned repeat c/section at 37 weeks. Reviewed that we would recommend a sooner delivery for severe range blood pressures or concerns about preeclampsia with worsening symptoms. Plan to keep appointment tomorrow for scheduled NST. Instructed to check blood pressure at home twice daily. Strict preeclampsia precautions were reviewed and when to return to L&D.  She was deemed stable for discharge and further outpatient management.   Discharge Condition: stable  Disposition: Discharge disposition: 01-Home or Self Care        Allergies as of 03/17/2024   No Known Allergies      Medication List     TAKE these medications    aspirin 81 MG chewable tablet Chew 81 mg by mouth daily.   levothyroxine 100 MCG tablet Commonly known as: SYNTHROID Take 100 mcg by mouth daily before breakfast.   prenatal multivitamin Tabs tablet Take 1 tablet by mouth daily.   ursodiol 300 MG capsule Commonly known as: ACTIGALL Take 300  mg by mouth 3 (three) times daily.        Follow-up Information     [facility]. Go in 1 day(s).   Why: routine prenatal appointment and NST Contact information: [address] [place] [address] [phone]               ----- Gustavo Lah, CNM  Certified Nurse Midwife [facility]

## 2024-03-21 ENCOUNTER — Other Ambulatory Visit: Payer: Self-pay

## 2024-03-21 ENCOUNTER — Encounter
Admission: RE | Admit: 2024-03-21 | Discharge: 2024-03-21 | Disposition: A | Discharge status: Home / Self Care | Source: Ambulatory Visit | Attending: Obstetrics and Gynecology | Admitting: Obstetrics and Gynecology

## 2024-03-21 ENCOUNTER — Other Ambulatory Visit

## 2024-03-21 HISTORY — DX: Gastro-esophageal reflux disease without esophagitis: K21.9

## 2024-03-21 HISTORY — DX: Supervision of elderly multigravida, third trimester: O09.523

## 2024-03-21 HISTORY — DX: History of uterine scar from previous surgery: Z98.891

## 2024-03-21 HISTORY — DX: Anemia, unspecified: D64.9

## 2024-03-21 HISTORY — DX: Intrahepatic cholestasis of pregnancy, third trimester: O26.643

## 2024-03-21 HISTORY — DX: Unspecified maternal hypertension, third trimester: O16.3

## 2024-03-21 NOTE — Patient Instructions (Signed)
 Your procedure is scheduled on:03-24-24 Thursday  Arrival Time: 5:30 AM. Please call Labor and Delivery if you have any questions. [phone].  Arrival: If your arrival time is prior to 6:00 am, please enter through the Emergency Room Entrance and you will be directed to Labor and Delivery. If your arrival time is 6:00 am or later, please enter the Medical Mall and follow the greeter's instructions.  REMEMBER: Instructions that are not followed completely may result in serious medical risk, up to and including death; or upon the discretion of your surgeon and anesthesiologist your surgery may need to be rescheduled.  Do not eat food OR drink any liquids after midnight the night before surgery.  No gum chewing or hard candies.  One week prior to surgery:Stop NOW (03-21-24) Stop Anti-inflammatories (NSAIDS) such as Advil, Aleve, Ibuprofen, Motrin, Naproxen, Naprosyn and Aspirin based products such as Excedrin, Goody's Powder, BC Powder. Stop ANY OVER THE COUNTER supplements until after surgery.  You may however, continue to take Tylenol if needed for pain up until the day of surgery.  Continue taking all of your other prescription medications up until the day of surgery.  ON THE DAY OF SURGERY ONLY TAKE THESE MEDICATIONS WITH SIPS OF WATER: -levothyroxine (SYNTHROID)   No Alcohol for 24 hours before or after surgery.  No Smoking including e-cigarettes for 24 hours prior to surgery.  No chewable tobacco products for at least 6 hours prior to surgery.  No nicotine patches on the day of surgery.  Do not use any "recreational" drugs for at least a week prior to your surgery.  Please be advised that the combination of cocaine and anesthesia may have negative outcomes, up to and including death. If you test positive for cocaine, your surgery will be cancelled.  On the morning of surgery brush your teeth with toothpaste and water, you may rinse your mouth with mouthwash if you wish. Do not  swallow any toothpaste or mouthwash.  Use CHG soap as directed on instruction sheet.  Do not wear jewelry, make-up, hairpins, clips or nail polish.  For welded (permanent) jewelry: bracelets, anklets, waist bands, etc.  Please have this removed prior to surgery.  If it is not removed, there is a chance that hospital personnel will need to cut it off on the day of surgery.  Do not wear lotions, powders, or perfumes.   Do not shave body hair from the neck down 48 hours before surgery.  Contact lenses, hearing aids and dentures may not be worn into surgery.  Do not bring valuables to the hospital. [facility] is not responsible for any missing/lost belongings or valuables.   Notify your doctor if there is any change in your medical condition (cold, fever, infection).  Wear comfortable clothing (specific to your surgery type) to the hospital.  After surgery, you can help prevent lung complications by doing breathing exercises.  Take deep breaths and cough every 1-2 hours. Your doctor may order a device called an Incentive Spirometer to help you take deep breaths. When coughing or sneezing, hold a pillow firmly against your incision with both hands. This is called "splinting." Doing this helps protect your incision. It also decreases belly discomfort.  Please call the Pre-admissions Testing Dept. at [phone] if you have any questions about these instructions.  Surgery Visitation Policy:  Visitor Passes   All visitors, including children, need an identification sticker when visiting. These stickers must be worn where they can be seen.   Labor & Delivery  Laboring women may have one designated support person and two other visitors of any age visit. The support person must remain the same. The visitors may switch with other visitors. Visitation is permitted 24 hours per day. The designated support person or a visitor over the age of 16 may sleep overnight in the patient's room. A  doula registered with [facility] for labor and delivery support is not considered a visitor. Doulas not registered with [facility] are considered visitors.  Mother Baby Unit, OB Specialty and Gynecological Care  A designated support person and three visitors of any age may visit. The three visitors may switch out. The designated support person or a visitor age 5 or older may stay overnight in the room. During the postpartum period (up to 6 weeks), if the mother is the patient, she can have her newborn stay with her if there is another support person present who can be responsible for the baby.      Preparing for Surgery with CHLORHEXIDINE GLUCONATE (CHG) Soap  Chlorhexidine Gluconate (CHG) Soap  o An antiseptic cleaner that kills germs and bonds with the skin to continue killing germs even after washing  o Used for showering the night before surgery and morning of surgery  Before surgery, you can play an important role by reducing the number of germs on your skin.  CHG (Chlorhexidine gluconate) soap is an antiseptic cleanser which kills germs and bonds with the skin to continue killing germs even after washing.  Please do not use if you have an allergy to CHG or antibacterial soaps. If your skin becomes reddened/irritated stop using the CHG.  1. Shower the NIGHT BEFORE SURGERY and the MORNING OF SURGERY with CHG soap.  2. If you choose to wash your hair, wash your hair first as usual with your normal shampoo.  3. After shampooing, rinse your hair and body thoroughly to remove the shampoo.  4. Use CHG as you would any other liquid soap. You can apply CHG directly to the skin and wash gently with a scrungie or a clean washcloth.  5. Apply the CHG soap to your body only from the neck down. Do not use on open wounds or open sores. Avoid contact with your eyes, ears, mouth, and genitals (private parts). Wash face and genitals (private parts) with your normal soap.  6. Wash  thoroughly, paying special attention to the area where your surgery will be performed.  7. Thoroughly rinse your body with warm water.  8. Do not shower/wash with your normal soap after using and rinsing off the CHG soap.  9. Pat yourself dry with a clean towel.  10. Wear clean pajamas to bed the night before surgery.  12. Place clean sheets on your bed the night of your first shower and do not sleep with pets.  13. Shower again with the CHG soap on the day of surgery prior to arriving at the hospital.  14. Do not apply any deodorants/lotions/powders.  15. Please wear clean clothes to the hospital.  How to Use an Incentive Spirometer An incentive spirometer is a tool that measures how well you are filling your lungs with each breath. Learning to take long, deep breaths using this tool can help you keep your lungs clear and active. This may help to reverse or lessen your chance of developing breathing (pulmonary) problems, especially infection. You may be asked to use a spirometer: After a surgery. If you have a lung problem or a history of smoking.  After a long period of time when you have been unable to move or be active. If the spirometer includes an indicator to show the highest number that you have reached, your health care provider or respiratory therapist will help you set a goal. Keep a log of your progress as told by your health care provider. What are the risks? Breathing too quickly may cause dizziness or cause you to pass out. Take your time so you do not get dizzy or light-headed. If you are in pain, you may need to take pain medicine before doing incentive spirometry. It is harder to take a deep breath if you are having pain. How to use your incentive spirometer  Sit up on the edge of your bed or on a chair. Hold the incentive spirometer so that it is in an upright position. Before you use the spirometer, breathe out normally. Place the mouthpiece in your mouth. Make sure  your lips are closed tightly around it. Breathe in slowly and as deeply as you can through your mouth, causing the piston or the ball to rise toward the top of the chamber. Hold your breath for 3-5 seconds, or for as long as possible. If the spirometer includes a coach indicator, use this to guide you in breathing. Slow down your breathing if the indicator goes above the marked areas. Remove the mouthpiece from your mouth and breathe out normally. The piston or ball will return to the bottom of the chamber. Rest for a few seconds, then repeat the steps 10 or more times. Take your time and take a few normal breaths between deep breaths so that you do not get dizzy or light-headed. Do this every 1-2 hours when you are awake. If the spirometer includes a goal marker to show the highest number you have reached (best effort), use this as a goal to work toward during each repetition. After each set of 10 deep breaths, cough a few times. This will help to make sure that your lungs are clear. If you have an incision on your chest or abdomen from surgery, place a pillow or a rolled-up towel firmly against the incision when you cough. This can help to reduce pain while taking deep breaths and coughing. General tips When you are able to get out of bed: Walk around often. Continue to take deep breaths and cough in order to clear your lungs. Keep using the incentive spirometer until your health care provider says it is okay to stop using it. If you have been in the hospital, you may be told to keep using the spirometer at home. Contact a health care provider if: You are having difficulty using the spirometer. You have trouble using the spirometer as often as instructed. Your pain medicine is not giving enough relief for you to use the spirometer as told. You have a fever. Get help right away if: You develop shortness of breath. You develop a cough with bloody mucus from the lungs. You have fluid or blood  coming from an incision site after you cough. Summary An incentive spirometer is a tool that can help you learn to take long, deep breaths to keep your lungs clear and active. You may be asked to use a spirometer after a surgery, if you have a lung problem or a history of smoking, or if you have been inactive for a long period of time. Use your incentive spirometer as instructed every 1-2 hours while you are awake. If you have an  incision on your chest or abdomen, place a pillow or a rolled-up towel firmly against your incision when you cough. This will help to reduce pain. Get help right away if you have shortness of breath, you cough up bloody mucus, or blood comes from your incision when you cough. This information is not intended to replace advice given to you by your health care provider. Make sure you discuss any questions you have with your health care provider.

## 2024-03-22 ENCOUNTER — Encounter
Admission: RE | Admit: 2024-03-22 | Discharge: 2024-03-22 | Disposition: A | Discharge status: Home / Self Care | Source: Ambulatory Visit | Attending: Obstetrics and Gynecology | Admitting: Obstetrics and Gynecology

## 2024-03-22 DIAGNOSIS — Z01818 Encounter for other preprocedural examination: Secondary | ICD-10-CM

## 2024-03-22 DIAGNOSIS — Z01812 Encounter for preprocedural laboratory examination: Secondary | ICD-10-CM | POA: Insufficient documentation

## 2024-03-22 LAB — CBC
HCT: 36.7 % (ref 36.0–46.0)
Hemoglobin: 12.7 g/dL (ref 12.0–15.0)
MCH: 31.8 pg (ref 26.0–34.0)
MCHC: 34.6 g/dL (ref 30.0–36.0)
MCV: 92 fL (ref 80.0–100.0)
Platelets: 234 10*3/uL (ref 150–400)
RBC: 3.99 MIL/uL (ref 3.87–5.11)
RDW: 12.7 % (ref 11.5–15.5)
WBC: 6.3 10*3/uL (ref 4.0–10.5)
nRBC: 0 % (ref 0.0–0.2)

## 2024-03-22 LAB — TYPE AND SCREEN
ABO/RH(D): O POS
Antibody Screen: NEGATIVE
Extend sample reason: UNDETERMINED

## 2024-03-23 LAB — RPR: RPR Ser Ql: NONREACTIVE

## 2024-03-23 MED ORDER — CEFAZOLIN SODIUM-DEXTROSE 2-4 GM/100ML-% IV SOLN
2.0000 g | INTRAVENOUS | Status: AC
  Administered 2024-03-24: 2 g via INTRAVENOUS
  Filled 2024-03-23: qty 100

## 2024-03-23 MED ORDER — LACTATED RINGERS IV SOLN: Freq: Once | INTRAVENOUS | Status: AC

## 2024-03-23 MED ORDER — GABAPENTIN 300 MG PO CAPS
300.0000 mg | ORAL_CAPSULE | Freq: Once | ORAL | Status: AC
  Administered 2024-03-24: 300 mg via ORAL
  Filled 2024-03-23: qty 1

## 2024-03-23 MED ORDER — LACTATED RINGERS IV SOLN: Freq: Once | INTRAVENOUS | Status: DC

## 2024-03-23 MED ORDER — ACETAMINOPHEN 500 MG PO TABS
1000.0000 mg | ORAL_TABLET | Freq: Once | ORAL | Status: AC
  Administered 2024-03-24: 1000 mg via ORAL
  Filled 2024-03-23: qty 2

## 2024-03-23 MED ORDER — ORAL CARE MOUTH RINSE: 15.0000 mL | Freq: Once | OROMUCOSAL | Status: AC

## 2024-03-23 MED ORDER — CHLORHEXIDINE GLUCONATE 0.12 % MT SOLN
15.0000 mL | Freq: Once | OROMUCOSAL | Status: AC
  Administered 2024-03-24: 15 mL via OROMUCOSAL
  Filled 2024-03-23: qty 15

## 2024-03-24 ENCOUNTER — Encounter: Payer: Self-pay | Admitting: Obstetrics and Gynecology

## 2024-03-24 ENCOUNTER — Encounter
Admission: RE | Disposition: A | Discharge status: Home / Self Care | Payer: Self-pay | Source: Ambulatory Visit | Attending: Obstetrics and Gynecology

## 2024-03-24 ENCOUNTER — Inpatient Hospital Stay: Admitting: Urgent Care

## 2024-03-24 ENCOUNTER — Inpatient Hospital Stay
Admission: RE | Admit: 2024-03-24 | Discharge: 2024-03-26 | DRG: 787 | Disposition: A | Discharge status: Home / Self Care | Source: Ambulatory Visit | Attending: Obstetrics | Admitting: Obstetrics

## 2024-03-24 ENCOUNTER — Inpatient Hospital Stay: Admitting: General Practice

## 2024-03-24 ENCOUNTER — Other Ambulatory Visit: Payer: Self-pay

## 2024-03-24 DIAGNOSIS — K7689 Other specified diseases of liver: Secondary | ICD-10-CM | POA: Diagnosis present

## 2024-03-24 DIAGNOSIS — E039 Hypothyroidism, unspecified: Secondary | ICD-10-CM | POA: Diagnosis present

## 2024-03-24 DIAGNOSIS — O99284 Endocrine, nutritional and metabolic diseases complicating childbirth: Secondary | ICD-10-CM | POA: Diagnosis present

## 2024-03-24 DIAGNOSIS — K219 Gastro-esophageal reflux disease without esophagitis: Secondary | ICD-10-CM | POA: Diagnosis present

## 2024-03-24 DIAGNOSIS — Z98891 History of uterine scar from previous surgery: Secondary | ICD-10-CM

## 2024-03-24 DIAGNOSIS — O34219 Maternal care for unspecified type scar from previous cesarean delivery: Principal | ICD-10-CM | POA: Diagnosis present

## 2024-03-24 DIAGNOSIS — O9081 Anemia of the puerperium: Secondary | ICD-10-CM | POA: Diagnosis not present

## 2024-03-24 DIAGNOSIS — O09523 Supervision of elderly multigravida, third trimester: Secondary | ICD-10-CM | POA: Diagnosis present

## 2024-03-24 DIAGNOSIS — Z3A37 37 weeks gestation of pregnancy: Secondary | ICD-10-CM | POA: Diagnosis not present

## 2024-03-24 DIAGNOSIS — O9962 Diseases of the digestive system complicating childbirth: Secondary | ICD-10-CM | POA: Diagnosis present

## 2024-03-24 DIAGNOSIS — O26643 Intrahepatic cholestasis of pregnancy, third trimester: Secondary | ICD-10-CM | POA: Diagnosis present

## 2024-03-24 DIAGNOSIS — O34211 Maternal care for low transverse scar from previous cesarean delivery: Principal | ICD-10-CM | POA: Diagnosis present

## 2024-03-24 DIAGNOSIS — D62 Acute posthemorrhagic anemia: Secondary | ICD-10-CM | POA: Diagnosis not present

## 2024-03-24 DIAGNOSIS — O139 Gestational [pregnancy-induced] hypertension without significant proteinuria, unspecified trimester: Secondary | ICD-10-CM | POA: Diagnosis present

## 2024-03-24 DIAGNOSIS — Z8249 Family history of ischemic heart disease and other diseases of the circulatory system: Secondary | ICD-10-CM | POA: Diagnosis not present

## 2024-03-24 DIAGNOSIS — O99214 Obesity complicating childbirth: Secondary | ICD-10-CM | POA: Diagnosis present

## 2024-03-24 DIAGNOSIS — O99210 Obesity complicating pregnancy, unspecified trimester: Secondary | ICD-10-CM | POA: Diagnosis present

## 2024-03-24 SURGERY — Surgical Case
Anesthesia: Spinal | Site: Abdomen

## 2024-03-24 MED ORDER — SOD CITRATE-CITRIC ACID 500-334 MG/5ML PO SOLN: 30.0000 mL | ORAL | Status: AC

## 2024-03-24 MED ORDER — 0.9 % SODIUM CHLORIDE (POUR BTL) OPTIME
TOPICAL | Status: DC | PRN
  Administered 2024-03-24: 1000 mL

## 2024-03-24 MED ORDER — FENTANYL CITRATE (PF) 100 MCG/2ML IJ SOLN
INTRAMUSCULAR | Status: AC
  Filled 2024-03-24: qty 2

## 2024-03-24 MED ORDER — ENOXAPARIN SODIUM 40 MG/0.4ML IJ SOSY
40.0000 mg | PREFILLED_SYRINGE | INTRAMUSCULAR | Status: DC
  Administered 2024-03-25 – 2024-03-26 (×2): 40 mg via SUBCUTANEOUS
  Filled 2024-03-24 (×2): qty 0.4

## 2024-03-24 MED ORDER — ONDANSETRON HCL 4 MG/2ML IJ SOLN
INTRAMUSCULAR | Status: AC
  Filled 2024-03-24: qty 2

## 2024-03-24 MED ORDER — KETOROLAC TROMETHAMINE 30 MG/ML IJ SOLN
30.0000 mg | Freq: Four times a day (QID) | INTRAMUSCULAR | Status: AC
  Administered 2024-03-24 – 2024-03-25 (×4): 30 mg via INTRAVENOUS
  Filled 2024-03-24 (×4): qty 1

## 2024-03-24 MED ORDER — PRENATAL MULTIVITAMIN CH
1.0000 | ORAL_TABLET | Freq: Every day | ORAL | Status: DC
Start: 2024-03-25 — End: 2024-03-26
  Administered 2024-03-25 – 2024-03-26 (×2): 1 via ORAL
  Filled 2024-03-24 (×2): qty 1

## 2024-03-24 MED ORDER — SIMETHICONE 80 MG PO CHEW: 80.0000 mg | CHEWABLE_TABLET | ORAL | Status: DC | PRN

## 2024-03-24 MED ORDER — ONDANSETRON HCL 4 MG/2ML IJ SOLN
INTRAMUSCULAR | Status: DC | PRN
  Administered 2024-03-24: 4 mg via INTRAVENOUS

## 2024-03-24 MED ORDER — PHENYLEPHRINE HCL-NACL 20-0.9 MG/250ML-% IV SOLN
INTRAVENOUS | Status: AC
  Filled 2024-03-24: qty 250

## 2024-03-24 MED ORDER — ACETAMINOPHEN 500 MG PO TABS
1000.0000 mg | ORAL_TABLET | Freq: Four times a day (QID) | ORAL | Status: DC
  Administered 2024-03-24 – 2024-03-26 (×8): 1000 mg via ORAL
  Filled 2024-03-24 (×8): qty 2

## 2024-03-24 MED ORDER — SENNOSIDES-DOCUSATE SODIUM 8.6-50 MG PO TABS
2.0000 | ORAL_TABLET | Freq: Every day | ORAL | Status: DC
  Administered 2024-03-25 – 2024-03-26 (×2): 2 via ORAL
  Filled 2024-03-24 (×2): qty 2

## 2024-03-24 MED ORDER — DIPHENHYDRAMINE HCL 50 MG/ML IJ SOLN: 12.5000 mg | INTRAMUSCULAR | Status: DC | PRN

## 2024-03-24 MED ORDER — SODIUM CHLORIDE 0.9% FLUSH
INTRAVENOUS | Status: DC | PRN
  Administered 2024-03-24: 20 mL via INTRAVENOUS

## 2024-03-24 MED ORDER — DEXAMETHASONE SODIUM PHOSPHATE 10 MG/ML IJ SOLN
INTRAMUSCULAR | Status: DC | PRN
  Administered 2024-03-24: 10 mg via INTRAVENOUS

## 2024-03-24 MED ORDER — OXYTOCIN-SODIUM CHLORIDE 30-0.9 UT/500ML-% IV SOLN
INTRAVENOUS | Status: AC
  Filled 2024-03-24: qty 1000

## 2024-03-24 MED ORDER — MENTHOL 3 MG MT LOZG: 1.0000 | LOZENGE | OROMUCOSAL | Status: DC | PRN

## 2024-03-24 MED ORDER — DEXAMETHASONE SODIUM PHOSPHATE 10 MG/ML IJ SOLN
INTRAMUSCULAR | Status: AC
  Filled 2024-03-24: qty 1

## 2024-03-24 MED ORDER — GABAPENTIN 300 MG PO CAPS
300.0000 mg | ORAL_CAPSULE | Freq: Every day | ORAL | Status: DC
  Administered 2024-03-25: 300 mg via ORAL
  Filled 2024-03-24 (×2): qty 1

## 2024-03-24 MED ORDER — KETOROLAC TROMETHAMINE 30 MG/ML IJ SOLN
INTRAMUSCULAR | Status: DC | PRN
  Administered 2024-03-24: 30 mg via INTRAVENOUS

## 2024-03-24 MED ORDER — SOD CITRATE-CITRIC ACID 500-334 MG/5ML PO SOLN
ORAL | Status: AC
Start: 2024-03-24 — End: 2024-03-24
  Administered 2024-03-24: 30 mL via ORAL
  Filled 2024-03-24: qty 15

## 2024-03-24 MED ORDER — NALOXONE HCL 4 MG/10ML IJ SOLN: 1.0000 ug/kg/h | INTRAVENOUS | Status: DC | PRN

## 2024-03-24 MED ORDER — ZOLPIDEM TARTRATE 5 MG PO TABS: 5.0000 mg | ORAL_TABLET | Freq: Every evening | ORAL | Status: DC | PRN

## 2024-03-24 MED ORDER — ACETAMINOPHEN 500 MG PO TABS
1000.0000 mg | ORAL_TABLET | Freq: Four times a day (QID) | ORAL | Status: DC
  Administered 2024-03-24: 1000 mg via ORAL
  Filled 2024-03-24: qty 2

## 2024-03-24 MED ORDER — NALOXONE HCL 0.4 MG/ML IJ SOLN: 0.4000 mg | INTRAMUSCULAR | Status: DC | PRN

## 2024-03-24 MED ORDER — DIPHENHYDRAMINE HCL 25 MG PO CAPS: 25.0000 mg | ORAL_CAPSULE | ORAL | Status: DC | PRN

## 2024-03-24 MED ORDER — WITCH HAZEL-GLYCERIN EX PADS: 1.0000 | MEDICATED_PAD | CUTANEOUS | Status: DC | PRN

## 2024-03-24 MED ORDER — OXYCODONE HCL 5 MG PO TABS
5.0000 mg | ORAL_TABLET | ORAL | Status: DC | PRN
  Administered 2024-03-25: 5 mg via ORAL
  Filled 2024-03-24: qty 1

## 2024-03-24 MED ORDER — FENTANYL CITRATE (PF) 100 MCG/2ML IJ SOLN
INTRAMUSCULAR | Status: DC | PRN
  Administered 2024-03-24: 15 ug via INTRAVENOUS

## 2024-03-24 MED ORDER — LACTATED RINGERS IV SOLN: INTRAVENOUS | Status: DC

## 2024-03-24 MED ORDER — BUPIVACAINE HCL (PF) 0.25 % IJ SOLN
INTRAMUSCULAR | Status: AC
  Filled 2024-03-24: qty 30

## 2024-03-24 MED ORDER — PHENYLEPHRINE HCL-NACL 20-0.9 MG/250ML-% IV SOLN
INTRAVENOUS | Status: DC | PRN
  Administered 2024-03-24: 50 ug/min via INTRAVENOUS

## 2024-03-24 MED ORDER — OXYTOCIN-SODIUM CHLORIDE 30-0.9 UT/500ML-% IV SOLN
INTRAVENOUS | Status: DC | PRN
  Administered 2024-03-24: 600 m[IU]/min via INTRAVENOUS

## 2024-03-24 MED ORDER — LIDOCAINE HCL (PF) 2 % IJ SOLN
INTRAMUSCULAR | Status: AC
  Filled 2024-03-24: qty 5

## 2024-03-24 MED ORDER — MORPHINE SULFATE (PF) 0.5 MG/ML IJ SOLN
INTRAMUSCULAR | Status: DC | PRN
Start: 2024-03-24 — End: 2024-03-24
  Administered 2024-03-24: .1 mg via EPIDURAL

## 2024-03-24 MED ORDER — BUPIVACAINE IN DEXTROSE 0.75-8.25 % IT SOLN
INTRATHECAL | Status: DC | PRN
Start: 2024-03-24 — End: 2024-03-24
  Administered 2024-03-24: 1.6 mL via INTRATHECAL

## 2024-03-24 MED ORDER — SODIUM CHLORIDE 0.9% FLUSH: 3.0000 mL | INTRAVENOUS | Status: DC | PRN

## 2024-03-24 MED ORDER — KETOROLAC TROMETHAMINE 30 MG/ML IJ SOLN: 30.0000 mg | Freq: Four times a day (QID) | INTRAMUSCULAR | Status: AC | PRN

## 2024-03-24 MED ORDER — PROPOFOL 10 MG/ML IV BOLUS
INTRAVENOUS | Status: AC
  Filled 2024-03-24: qty 20

## 2024-03-24 MED ORDER — ONDANSETRON HCL 4 MG/2ML IJ SOLN: 4.0000 mg | Freq: Three times a day (TID) | INTRAMUSCULAR | Status: DC | PRN

## 2024-03-24 MED ORDER — MORPHINE SULFATE (PF) 2 MG/ML IV SOLN: 1.0000 mg | INTRAVENOUS | Status: DC | PRN

## 2024-03-24 MED ORDER — SCOPOLAMINE 1 MG/3DAYS TD PT72
1.0000 | MEDICATED_PATCH | Freq: Once | TRANSDERMAL | Status: DC
  Administered 2024-03-24: 1.5 mg via TRANSDERMAL
  Filled 2024-03-24: qty 1

## 2024-03-24 MED ORDER — DIBUCAINE (PERIANAL) 1 % EX OINT: 1.0000 | TOPICAL_OINTMENT | CUTANEOUS | Status: DC | PRN

## 2024-03-24 MED ORDER — OXYTOCIN-SODIUM CHLORIDE 30-0.9 UT/500ML-% IV SOLN
2.5000 [IU]/h | INTRAVENOUS | Status: AC
  Administered 2024-03-24: 2.5 [IU]/h via INTRAVENOUS

## 2024-03-24 MED ORDER — BUPIVACAINE HCL (PF) 0.25 % IJ SOLN
INTRAMUSCULAR | Status: DC | PRN
  Administered 2024-03-24: 60 mL

## 2024-03-24 MED ORDER — MORPHINE SULFATE (PF) 0.5 MG/ML IJ SOLN
INTRAMUSCULAR | Status: AC
  Filled 2024-03-24: qty 10

## 2024-03-24 MED ORDER — OXYCODONE HCL 5 MG PO TABS: 5.0000 mg | ORAL_TABLET | Freq: Four times a day (QID) | ORAL | Status: DC | PRN

## 2024-03-24 MED ORDER — LEVOTHYROXINE SODIUM 100 MCG PO TABS
100.0000 ug | ORAL_TABLET | Freq: Every day | ORAL | Status: DC
  Filled 2024-03-24 (×2): qty 1

## 2024-03-24 MED ORDER — IBUPROFEN 600 MG PO TABS
600.0000 mg | ORAL_TABLET | Freq: Four times a day (QID) | ORAL | Status: DC
  Administered 2024-03-25 – 2024-03-26 (×4): 600 mg via ORAL
  Filled 2024-03-24 (×4): qty 1

## 2024-03-24 MED ORDER — DIPHENHYDRAMINE HCL 25 MG PO CAPS: 25.0000 mg | ORAL_CAPSULE | Freq: Four times a day (QID) | ORAL | Status: DC | PRN

## 2024-03-24 MED ORDER — COCONUT OIL OIL
1.0000 | TOPICAL_OIL | Status: DC | PRN
  Filled 2024-03-24: qty 7.5

## 2024-03-24 MED ORDER — KETOROLAC TROMETHAMINE 30 MG/ML IJ SOLN
INTRAMUSCULAR | Status: AC
  Filled 2024-03-24: qty 1

## 2024-03-24 MED ORDER — SIMETHICONE 80 MG PO CHEW
80.0000 mg | CHEWABLE_TABLET | Freq: Three times a day (TID) | ORAL | Status: DC
  Administered 2024-03-24 – 2024-03-25 (×4): 80 mg via ORAL
  Filled 2024-03-24 (×5): qty 1

## 2024-03-24 MED ORDER — BUPIVACAINE HCL (PF) 0.25 % IJ SOLN
INTRAMUSCULAR | Status: AC
Start: 2024-03-24 — End: 2024-03-24
  Filled 2024-03-24: qty 30

## 2024-03-24 SURGICAL SUPPLY — 31 items
BARRIER ADHS 3X4 INTERCEED (GAUZE/BANDAGES/DRESSINGS) ×1 IMPLANT
CHLORAPREP W/TINT 26 (MISCELLANEOUS) ×1 IMPLANT
DRSG TELFA 3X8 NADH STRL (GAUZE/BANDAGES/DRESSINGS) ×1 IMPLANT
ELECT CAUTERY BLADE 6.4 (BLADE) ×1 IMPLANT
ELECT REM PT RETURN 9FT ADLT (ELECTROSURGICAL) ×1 IMPLANT
ELECTRODE REM PT RTRN 9FT ADLT (ELECTROSURGICAL) ×1 IMPLANT
GAUZE SPONGE 4X4 12PLY STRL (GAUZE/BANDAGES/DRESSINGS) ×1 IMPLANT
GLOVE SURG SYN 8.0 (GLOVE) ×1 IMPLANT
GLOVE SURG SYN 8.0 PF PI (GLOVE) ×1 IMPLANT
GOWN STRL REUS W/ TWL LRG LVL3 (GOWN DISPOSABLE) ×2 IMPLANT
GOWN STRL REUS W/ TWL XL LVL3 (GOWN DISPOSABLE) ×1 IMPLANT
MANIFOLD NEPTUNE II (INSTRUMENTS) ×1 IMPLANT
MAT PREVALON FULL STRYKER (MISCELLANEOUS) ×1 IMPLANT
NDL HYPO 22X1.5 SAFETY MO (MISCELLANEOUS) ×1 IMPLANT
NEEDLE HYPO 22X1.5 SAFETY MO (MISCELLANEOUS) ×1 IMPLANT
NS IRRIG 1000ML POUR BTL (IV SOLUTION) ×1 IMPLANT
PACK C SECTION AR (MISCELLANEOUS) ×1 IMPLANT
PAD OB MATERNITY 11 LF (PERSONAL CARE ITEMS) ×1 IMPLANT
PAD PREP OB/GYN DISP 24X41 (PERSONAL CARE ITEMS) ×1 IMPLANT
SCRUB CHG 4% DYNA-HEX 4OZ (MISCELLANEOUS) ×1 IMPLANT
STRAP SAFETY 5IN WIDE (MISCELLANEOUS) ×1 IMPLANT
SUCT VACUUM KIWI BELL (SUCTIONS) IMPLANT
SUT CHROMIC 1 CTX 36 (SUTURE) ×3 IMPLANT
SUT PLAIN GUT 0 (SUTURE) ×2 IMPLANT
SUT VIC AB 0 CT1 36 (SUTURE) ×2 IMPLANT
SUT VIC AB 0 SH 27 (SUTURE) IMPLANT
SUT VICRYL+ 3-0 36IN CT-1 (SUTURE) IMPLANT
SYR 30ML LL (SYRINGE) ×2 IMPLANT
TAPE PAPER 3X10 WHT MICROPORE (GAUZE/BANDAGES/DRESSINGS) IMPLANT
TRAP FLUID SMOKE EVACUATOR (MISCELLANEOUS) ×1 IMPLANT
WATER STERILE IRR 500ML POUR (IV SOLUTION) ×1 IMPLANT

## 2024-03-24 NOTE — Anesthesia Preprocedure Evaluation (Signed)
 Anesthesia Evaluation  Patient identified by MRN, date of birth, ID band Patient awake    Reviewed: Allergy & Precautions, NPO status , Patient's Chart, lab work & pertinent test results  Airway Mallampati: III  TM Distance: >3 FB Neck ROM: full    Dental  (+) Chipped   Pulmonary neg pulmonary ROS   Pulmonary exam normal        Cardiovascular Exercise Tolerance: Good hypertension, On Medications negative cardio ROS Normal cardiovascular exam     Neuro/Psych    GI/Hepatic ,GERD  Medicated,,  Endo/Other  Hypothyroidism    Renal/GU   negative genitourinary   Musculoskeletal   Abdominal   Peds  Hematology negative hematology ROS (+)   Anesthesia Other Findings Past Medical History: No date: AMA (advanced maternal age) multigravida 35+, third trimester No date: Anemia No date: Cholestasis during pregnancy in third trimester No date: Elevated blood pressure complicating pregnancy in third  trimester, antepartum No date: GERD (gastroesophageal reflux disease) No date: Hypothyroidism No date: Miscarriage No date: Previous cesarean section  Past Surgical History: 06/03/2020: CESAREAN SECTION; N/A     Comment:  Procedure: CESAREAN SECTION;  Surgeon: Schermerhorn,               Ihor Austin, MD;  Location: [facility] ORS;  Service: Obstetrics;               Laterality: N/A; No date: Fertility Treatments  BMI    Body Mass Index: 36.61 kg/m      Reproductive/Obstetrics (+) Pregnancy                             Anesthesia Physical Anesthesia Plan  ASA: 3  Anesthesia Plan: Spinal   Post-op Pain Management:    Induction:   PONV Risk Score and Plan: 2 and Ondansetron, Dexamethasone and TIVA  Airway Management Planned: Natural Airway and Nasal Cannula  Additional Equipment:   Intra-op Plan:   Post-operative Plan:   Informed Consent: I have reviewed the patients History and Physical,  chart, labs and discussed the procedure including the risks, benefits and alternatives for the proposed anesthesia with the patient or authorized representative who has indicated his/her understanding and acceptance.     Dental Advisory Given  Plan Discussed with: Anesthesiologist, CRNA and Surgeon  Anesthesia Plan Comments: (Patient reports no bleeding problems and no anticoagulant use.  Plan for spinal with backup GA  Patient consented for risks of anesthesia including but not limited to:  - adverse reactions to medications - damage to eyes, teeth, lips or other oral mucosa - nerve damage due to positioning  - risk of bleeding, infection and or nerve damage from spinal that could lead to paralysis - risk of headache or failed spinal - damage to teeth, lips or other oral mucosa - sore throat or hoarseness - damage to heart, brain, nerves, lungs, other parts of body or loss of life  Patient voiced understanding and assent.)       Anesthesia Quick Evaluation

## 2024-03-24 NOTE — Op Note (Signed)
 Alexandra [name], Alexandra [name] MEDICAL RECORD NO: [id number] ACCOUNT NO: [id number] DATE OF BIRTH: Jul 24, 1981 FACILITY: [facility] LOCATION: [facility] PHYSICIAN: Suzy Bouchard, MD  Operative Report   DATE OF PROCEDURE: 03/24/2024  PREOPERATIVE DIAGNOSES: 1.  37 + 0 weeks estimated gestational age. 2.  Cholestasis of pregnancy. 3.  Advanced paternal age. 4.  Elective repeat cesarean section.  POSTOPERATIVE DIAGNOSES: 1.  37 + 0 weeks estimated gestational age. 2.  Cholestasis of pregnancy. 3.  Advanced paternal age. 4.  Elective repeat cesarean section. 5.  Vigorous female delivered.  PROCEDURE:  Elective repeat low transverse cesarean section.  ANESTHESIA:  Spinal.  SURGEON:  Suzy Bouchard, MD  FIRST ASSISTANT:  Margaretmary Eddy, certified nurse midwife.  INDICATIONS:  A 43 year old, gravida 3, para 1 patient with a repeat cesarean section and has elected for a repeat cesarean section.  The patient's pregnancy complicated by cholestasis of pregnancy and advanced maternal age.  DESCRIPTION OF PROCEDURE:  After adequate spinal anesthesia, the patient was placed in the dorsal supine position, with a hip roll on the right side.  The vagina and abdomen were prepped and draped in normal sterile fashion.  The patient did receive 2 g  of IV Ancef prior to commencement of the case.  Timeout was performed.  A Pfannenstiel incision was made 2 fingerbreadths above the symphysis pubis.  Sharp dissection was used to identify the fascia.  The fascia was opened in the midline and opened in a  transverse fashion.  The superior aspect of the fascia was grasped with Kocher clamps, and the recti muscles were dissected free.  The inferior aspect of the fascia was grasped with Kocher clamps.  The pyramidalis muscle was dissected free.  Entry into  the peritoneal cavity was accomplished sharply.  A direct low uterine segment incision was made in a transverse fashion upon entry into the endometrial cavity;  clear fluid resulted.  The incision was extended with blunt transverse traction.  The fetal  head was brought to the incision, and a Kiwi vacuum was placed on the flexion portion of the fetal head and allowed for delivery of the head, and the vacuum was removed.  The shoulders and body were delivered without difficulty.  A vigorous female was  then dried on the mother's abdomen for over 60 seconds.  The cord was then doubly clamped, and a vigorous female passed to nursery staff who assigned APGAR scores of 9 and 9; fetal weight 3100 grams.  The placenta was manually delivered, and the  endometrial cavity was wiped clean after exteriorizing the uterus.  The uterine incision was closed with 1-0 chromic suture in a running locking fashion.  Good approximation of edges.  Good hemostasis noted.  The posterior cul-de-sac was irrigated and  suctioned, and the uterus was placed back into the abdominal cavity.  The pericolic gutters were wiped clean with laparotomy tape.  The uterine incision again appeared hemostatic, and Interceed was placed over the uterine incision in a T-shaped fashion.   The fascia was then closed with 0 Vicryl suture in a running non-locking fashion.  Two separate sutures used.  Good approximation of the fascial edges.  Fascial edges were then injected with a solution of 60 mL of 0.25% Marcaine plus 20 mL of normal  saline.  30 mL of the solution was used.  Subcutaneous tissues were irrigated and Bovie cautery used for hemostasis, and the skin was reapproximated with INSORB absorbable staples.  Good cosmetic effect.  An additional 20 mL of  Marcaine solution was  injected beneath the skin.  Toradol 30 mg was given at the end of the case.  There were no complications.  The patient tolerated the procedure well and was taken to the recovery room in good condition.   PUS D: 03/24/2024 9:23:43 am T: 03/24/2024 3:59:00 pm  JOB: [id number]/ [id number]

## 2024-03-24 NOTE — Anesthesia Procedure Notes (Signed)
 Spinal  Start time: 03/24/2024 7:53 AM End time: 03/24/2024 8:07 AM Staffing Performed: anesthesiologist  Anesthesiologist: Stephanie Coup, MD Resident/CRNA: Omer Jack, CRNA Performed by: Omer Jack, CRNA Authorized by: Stephanie Coup, MD   Preanesthetic Checklist Completed: patient identified, IV checked, site marked, risks and benefits discussed, surgical consent, monitors and equipment checked, pre-op evaluation and timeout performed Spinal Block Patient position: sitting Prep: Betadine Patient monitoring: heart rate, continuous pulse ox and blood pressure Approach: midline Location: L3-4 Injection technique: single-shot Needle Needle type: Whitacre  Needle gauge: 22 G Needle length: 9 cm Assessment Events: CSF return

## 2024-03-24 NOTE — Lactation Note (Signed)
 This note was copied from a baby's chart. Lactation Consultation Note  Patient Name: Alexandra [name] [id number] Date: 03/24/2024 Age:41 hours Reason for consult: Follow-up assessment;Early term 37-38.6wks   Maternal Data This is mom's 2nd baby, C/S vacuum assisted. 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  LC assisted mom with breastfeeding several times today. Mom states, "I really want this to work this time." Has patient been taught Hand Expression?: Yes Does the patient have breastfeeding experience prior to this delivery?: Yes How long did the patient breastfeed?: approx 2 months  Feeding Mother's Current Feeding Choice: Breast Milk Assisted mom with breastfeeding .Provided mom with tips and strategies to maximize position and latch technique. Mom was able to independently latch baby well in cradle hold. Mom was able to identify baby 's audible swallows.   LATCH Score Latch: Grasps breast easily, tongue down, lips flanged, rhythmical sucking.  Audible Swallowing: Spontaneous and intermittent  Type of Nipple: Everted at rest and after stimulation  Comfort (Breast/Nipple): Soft / non-tender  Hold (Positioning): Assistance needed to correctly position infant at breast and maintain latch.  LATCH Score: 9   Interventions Interventions: Adjust position;Support pillows;Education;Breast massage Reviewed what to expect during first days when breastfeeding : feeding cues, 8-12 feeds in 24 hours, how to know the baby is getting enough, cluster feeding, normative weight loss and expectations about when baby will return to birth weight.  Consult Status Consult Status: Follow-up Date: 03/25/24 Follow-up type: In-patient  Update provided to care nurse  Fuller Song 03/24/2024, 7:30 PM

## 2024-03-24 NOTE — Brief Op Note (Signed)
 03/24/2024  9:03 AM  PATIENT:  Alexandra [name]  43 y.o. female  PRE-OPERATIVE DIAGNOSIS:  cholestasis of pregnancy elective repeat cesarean 37+[redacted] weeks EGA  POST-OPERATIVE DIAGNOSIS: same  PROCEDURE:  Procedure(s) with comments: CESAREAN DELIVERY (N/A) - REPEAT Repeat ltcs SURGEON:  Surgeons and Role:    * Koni Kannan, Ihor Austin, MD - Primary  PHYSICIAN ASSISTANT: Margaretmary Eddy , CNM   ASSISTANTS: cst   ANESTHESIA:  spinal  EBL: QBL : 300cc IOF 900 CC UO 50 cc BLOOD ADMINISTERED:none  DRAINS: Urinary Catheter (Foley)   LOCAL MEDICATIONS USED:  MARCAINE     SPECIMEN:  No Specimen  DISPOSITION OF SPECIMEN:  N/A  COUNTS:  YES  TOURNIQUET:  * No tourniquets in log *  DICTATION: .Other Dictation: Dictation Number verbal  PLAN OF CARE: Admit to inpatient   PATIENT DISPOSITION:  PACU - hemodynamically stable.   Delay start of Pharmacological VTE agent (>24hrs) due to surgical blood loss or risk of bleeding: not applicable

## 2024-03-24 NOTE — Transfer of Care (Signed)
 Immediate Anesthesia Transfer of Care Note  Patient: Alexandra [name]  Procedure(s) Performed: CESAREAN DELIVERY (Abdomen)  Patient Location: PACU and Mother/Baby  Anesthesia Type:Spinal  Level of Consciousness: awake, alert , and oriented  Airway & Oxygen Therapy: Patient Spontanous Breathing  Post-op Assessment: Report given to RN and Post -op Vital signs reviewed and stable  Post vital signs: Reviewed and stable  Last Vitals:  Vitals Value Taken Time  BP 118/80 03/24/24 0919  Temp    Pulse    Resp 16 03/24/24 0919  SpO2 98 % 03/24/24 0919    Last Pain:  Vitals:   03/24/24 0618  TempSrc:   PainSc: 0-No pain         Complications: No notable events documented.

## 2024-03-24 NOTE — Discharge Summary (Signed)
 Obstetrical Discharge Summary  Patient Name: Alexandra [name] DOB: 24-Mar-1981 MRN: [id number]  Date of Admission: 03/24/2024 Date of Delivery: 03/24/2024 Delivered by: Eustace Highland MD Date of Discharge: 03/26/24  Primary OB: [facility]  LMP:No LMP recorded. EDC Estimated Date of Delivery: 04/14/24 Gestational Age at Delivery: [redacted]w[redacted]d   Antepartum complications: cholestasis of pregnancy  Admitting Diagnosis: elective repeat LTCS  Secondary Diagnosis: Patient Active Problem List   Diagnosis Date Noted   Previous cesarean delivery affecting pregnancy 03/24/2024   AMA (advanced maternal age) multigravida 35+, third trimester 02/16/2024   Cholestasis during pregnancy in third trimester 02/16/2024   Obesity in pregnancy, antepartum 02/16/2024   Previous cesarean section 02/16/2024   Supervision of high risk pregnancy in first trimester 09/14/2023   Gestational hypertension 06/02/2020   Hypothyroidism 04/14/2008    Augmentation: N/A Date of Delivery: 03/26/2024  Delivered By: Eustace Highland MD Delivery Type: repeat cesarean section, low transverse incision Anesthesia:spinal  Placenta: spontaneous Laceration:  Episiotomy: none Newborn Data: Live born newborn  Birth Weight:  female  Wt 3100 gm  APGAR: ,9/9   Newborn Delivery   Birth date/time:  Delivery type:        Postpartum Procedures: none  Edinburgh:     03/26/2024    8:15 AM 06/05/2020    8:10 AM  Edinburgh Postnatal Depression Scale Screening Tool  I have been able to laugh and see the funny side of things. 0 0  I have looked forward with enjoyment to things. 0 0  I have blamed myself unnecessarily when things went wrong. 1 1  I have been anxious or worried for no good reason. 1 1  I have felt scared or panicky for no good reason. 0 1  Things have been getting on top of me. 1 1  I have been so unhappy that I have had difficulty sleeping. 0 0  I have felt sad or miserable. 0 0  I have been so unhappy  that I have been crying. 0 0  The thought of harming myself has occurred to me. 0 0  Edinburgh Postnatal Depression Scale Total 3 4    (Cesarean Section):  Patient had an uncomplicated postpartum course.  By time of discharge on POD#2, her pain was controlled on oral pain medications; she had appropriate lochia and was ambulating, voiding without difficulty, tolerating regular diet and passing flatus.   She was deemed stable for discharge to home.    Discharge Physical Exam:  BP 130/70 (BP Location: Left Arm)   Pulse 77   Temp 98.5 F (36.9 C) (Oral)   Resp 20   Ht 5\' 5"  (1.651 m)   Wt 99.8 kg   SpO2 99%   Breastfeeding Unknown   BMI 36.61 kg/m   General: NAD CV: RRR Pulm: CTABL, nl effort ABD: s/nd/nt, fundus firm and below the umbilicus Lochia: moderate Incision: c/d/i DVT Evaluation: LE non-ttp, no evidence of DVT on exam.  Hemoglobin  Date Value Ref Range Status  03/25/2024 9.2 (L) 12.0 - 15.0 g/dL Final   HCT  Date Value Ref Range Status  03/25/2024 26.9 (L) 36.0 - 46.0 % Final     Disposition: stable, discharge to home. Baby Feeding: breastmilk and formula Baby Disposition: home with mom  Rh Immune globulin given:  Rubella vaccine given:  Tdap vaccine given in AP or PP setting:  Flu vaccine given in AP or PP setting:   Contraception: previous bilateral salpingectomy    Risk assessment for postpartum VTE and prophylactic  treatment: Very high risk factors: None High risk factors: None Moderate risk factors: Cesarean delivery  and BMI 30-40 kg/m2  Postpartum VTE prophylaxis with LMWH not indicated  Prenatal Labs:   ABO, Rh:  O+ Antibody:  neg Rubella:  Imm , Vz Imm  RPR:   NR  HBsAg:   neg , hep c N/R HIV:   neg  GBS:  neg  Plan:  Alexandra [name] was discharged to home in good condition. Follow-up appointment with delivering provider in 6 weeks.  Discharge Medications: Allergies as of 03/26/2024   No Known Allergies      Medication List      STOP taking these medications    ursodiol 300 MG capsule Commonly known as: ACTIGALL       TAKE these medications    acetaminophen 500 MG tablet Commonly known as: TYLENOL Take 2 tablets (1,000 mg total) by mouth every 6 (six) hours.   coconut oil Oil Apply 1 Application topically as needed.   dibucaine 1 % Oint Commonly known as: NUPERCAINAL Place 1 Application rectally as needed for hemorrhoids.   ferrous sulfate 325 (65 FE) MG tablet Take 1 tablet (325 mg total) by mouth daily. Start taking on: March 27, 2024   ibuprofen 600 MG tablet Commonly known as: ADVIL Take 1 tablet (600 mg total) by mouth every 6 (six) hours.   levothyroxine 100 MCG tablet Commonly known as: SYNTHROID Take 1 tablet (100 mcg total) by mouth daily at 6 (six) AM. Start taking on: March 27, 2024 What changed: when to take this   oxyCODONE 5 MG immediate release tablet Commonly known as: Oxy IR/ROXICODONE Take 1-2 tablets (5-10 mg total) by mouth every 4 (four) hours as needed for moderate pain (pain score 4-6).   prenatal multivitamin Tabs tablet Take 1 tablet by mouth daily.   senna-docusate 8.6-50 MG tablet Commonly known as: Senokot-S Take 2 tablets by mouth daily. Start taking on: March 27, 2024   simethicone 80 MG chewable tablet Commonly known as: MYLICON Chew 1 tablet (80 mg total) by mouth as needed for flatulence.   witch hazel-glycerin pad Commonly known as: TUCKS Apply 1 Application topically as needed for hemorrhoids.         Follow-up Information     Schermerhorn, Joselyn Nicely, MD Follow up in 2 week(s).   Specialty: Obstetrics and Gynecology Why: post op check/ mood check Contact information: [address] [place] [address] [phone]                 Signed: Arzella Laurence CNM

## 2024-03-25 ENCOUNTER — Encounter: Payer: Self-pay | Admitting: Obstetrics and Gynecology

## 2024-03-25 LAB — CBC
HCT: 26.9 % — ABNORMAL LOW (ref 36.0–46.0)
Hemoglobin: 9.2 g/dL — ABNORMAL LOW (ref 12.0–15.0)
MCH: 31.9 pg (ref 26.0–34.0)
MCHC: 34.2 g/dL (ref 30.0–36.0)
MCV: 93.4 fL (ref 80.0–100.0)
Platelets: 171 10*3/uL (ref 150–400)
RBC: 2.88 MIL/uL — ABNORMAL LOW (ref 3.87–5.11)
RDW: 12.8 % (ref 11.5–15.5)
WBC: 8.5 10*3/uL (ref 4.0–10.5)
nRBC: 0 % (ref 0.0–0.2)

## 2024-03-25 MED ORDER — FERROUS SULFATE 325 (65 FE) MG PO TABS
325.0000 mg | ORAL_TABLET | Freq: Every day | ORAL | Status: DC
  Administered 2024-03-25 – 2024-03-26 (×2): 325 mg via ORAL
  Filled 2024-03-25 (×2): qty 1

## 2024-03-25 MED ORDER — LEVOTHYROXINE SODIUM 100 MCG PO TABS
100.0000 ug | ORAL_TABLET | Freq: Every day | ORAL | Status: DC
  Administered 2024-03-25 – 2024-03-26 (×2): 100 ug via ORAL
  Filled 2024-03-25 (×2): qty 1

## 2024-03-25 NOTE — Anesthesia Postprocedure Evaluation (Signed)
 Anesthesia Post Note  Patient: Alexandra [name]  Procedure(s) Performed: CESAREAN DELIVERY (Abdomen)  Patient location during evaluation: Mother Baby Anesthesia Type: Spinal Level of consciousness: oriented and awake and alert Pain management: pain level controlled Vital Signs Assessment: post-procedure vital signs reviewed and stable Respiratory status: spontaneous breathing and respiratory function stable Cardiovascular status: blood pressure returned to baseline and stable Postop Assessment: no headache, no backache, no apparent nausea or vomiting and able to ambulate Anesthetic complications: no   No notable events documented.   Last Vitals:  Vitals:   03/24/24 1935 03/25/24 0032  BP: 121/76 114/79  Pulse: (!) 59 (!) 53  Resp: 20 20  Temp: 37.2 C (!) 36.4 C  SpO2: 97% 97%    Last Pain:  Vitals:   03/25/24 0049  TempSrc:   PainSc: 2                  Rosanne Gutting

## 2024-03-25 NOTE — Progress Notes (Addendum)
 Postpartum/Post Operative Day 1 Subjective: Doing well, no complaints.  Tolerating regular diet, pain with PO meds, ambulating without difficulty, Foley catheter recently removed and has not voided yet.  No CP SOB Fever,Chills, N/V or leg pain; denies nipple or breast pain, no HA change of vision, RUQ/epigastric pain  Objective: BP 122/77 (BP Location: Left Arm)   Pulse 69   Temp 98.8 F (37.1 C) (Oral)   Resp 20   Ht 5\' 5"  (1.651 m)   Wt 99.8 kg   SpO2 97%   Breastfeeding Unknown   BMI 36.61 kg/m    Physical Exam:  General: NAD Breasts: soft/nontender CV: RRR Pulm: nl effort, CTABL Abdomen: soft, NT, BS x 4 Incision: Dsg CDI/Honeycomb dressing intact/no erythema or drainage Lochia: moderate Uterine Fundus: fundus firm and 1 fb below umbilicus DVT Evaluation: no cords, ttp LEs   Recent Labs    03/22/24 0934 03/25/24 0630  HGB 12.7 9.2*  HCT 36.7 26.9*  WBC 6.3 8.5  PLT 234 171    Assessment/Plan: 43 y.o. [id number] postpartum day # 1  - Continue routine PP care - Lactation consult PRN  - Discussed contraceptive options including implant, IUDs hormonal and non-hormonal, injection, pills/ring/patch, condoms, and NFP.  - Acute blood loss anemia, clinically significant - hemoglobin changed from 12.7 to 9.2, patient is asymptomatic, hemodynamically stable; start po ferrous sulfate BID with stool softeners - Immunization status:  all Imms up to date  Disposition: Does not desire Dc home today.   [name], CNM 03/25/2024 8:54 AM

## 2024-03-25 NOTE — Lactation Note (Signed)
 This note was copied from a baby's chart. Lactation Consultation Note  Patient Name: Alexandra [name] [id number] Date: 03/25/2024 Age:41 hours Reason for consult: Follow-up assessment;Difficult latch;Early term 37-38.6wks;Breastfeeding assistance;RN request   Maternal Data LC called to room to assist w/ a feeding.    Feeding Mother's Current Feeding Choice: Breast Milk and Donor Milk Nipple Type: Slow - flow  Several attempts were made at the breast to get infant to latch.   Patient has short, erect nipples.  Infant struggled to latch to the breast.  Patient also endorsed that "Alexandra [name]" has not sustained a latch.  LC introduced a 20mm nipple shield.  Infant did latch but did not continue to nurse.  After about 10-15 minutes, infant was fed 15ml of donor milk by pace bottle feeding.  LC then set patient up with a DEBP.  Patient is currently using a size 18mm flange that seemed to be a better fit for her breast.  Patient was able to pump out 1.33ml of EBM that was syringe fed back to infant.   LATCH Score Latch: Repeated attempts needed to sustain latch, nipple held in mouth throughout feeding, stimulation needed to elicit sucking reflex.  Audible Swallowing: None  Type of Nipple: Everted at rest and after stimulation  Comfort (Breast/Nipple): Soft / non-tender  Hold (Positioning): Assistance needed to correctly position infant at breast and maintain latch.  LATCH Score: 6  Lactation Tools Discussed/Used Tools: Pump;Coconut oil;Nipple Shields Nipple shield size: 20 Breast pump type: Double-Electric Breast Pump Pump Education: Setup, frequency, and cleaning Reason for Pumping: Infant not stimulating the breast and use of the nipple shield. Pumped volume: 1.5 mL  Interventions Interventions: Breast feeding basics reviewed;Assisted with latch;Breast compression;Adjust position;Support pillows;Position options;Coconut oil;DEBP;Education;Pace feeding  Patient felt more confident  after the pumping and feels like she has a good plan moving forward.  LC recommended for patient to continue to feed infant 8 -12x w/in a 24hr period.    Outpatient lactation number written on the white board in the room.     Consult Status Consult Status: Follow-up Date: 03/26/24 Follow-up type: In-patient    Alexandra [name] 03/25/2024, 5:10 PM

## 2024-03-25 NOTE — Lactation Note (Signed)
 This note was copied from a baby's chart. Lactation Consultation Note  Patient Name: Alexandra [name] [id number] Date: 03/25/2024 Age:41 hours Reason for consult: Follow-up assessment;Early term 37-38.6wks   Maternal Data Follow up assessment w/ a 25hr old baby Alexandra "Alexandra [name]".  Patient stated that she has been attempting to get infant to eat but she seems to be cozy and not interested at the time.    Feeding Mother's Current Feeding Choice: Breast Milk  No feeding observed during this feeding.  Interventions Interventions: Education  LC provided encouragement to parents about continuing to do STS also, informed patient to hand express and feed back any drops of milk back to infant.    [employer] phone number listed on board to call when in need of assistance.  Discharge    Consult Status Consult Status: Follow-up Follow-up type: In-patient    Yvette Rack Jule Whitsel 03/25/2024, 9:36 AM

## 2024-03-25 NOTE — Anesthesia Post-op Follow-up Note (Signed)
  Anesthesia Pain Follow-up Note  Patient: Alexandra [name]  Day #: 1  Date of Follow-up: 03/25/2024 Time: 7:37 AM  Last Vitals:  Vitals:   03/24/24 1935 03/25/24 0032  BP: 121/76 114/79  Pulse: (!) 59 (!) 53  Resp: 20 20  Temp: 37.2 C (!) 36.4 C  SpO2: 97% 97%    Level of Consciousness: alert  Pain: none   Side Effects:None  Catheter Site Exam:clean, dry, no drainage  Anti-Coag Meds (From admission, onward)    Start     Dose/Rate Route Frequency Ordered Stop   03/25/24 0800  enoxaparin (LOVENOX) injection 40 mg        40 mg Subcutaneous Every 24 hours 03/24/24 1551          Plan: D/C from anesthesia care at surgeon's request  Rosanne Gutting

## 2024-03-26 ENCOUNTER — Encounter: Payer: Self-pay | Admitting: Obstetrics and Gynecology

## 2024-03-26 ENCOUNTER — Ambulatory Visit: Payer: Self-pay

## 2024-03-26 MED ORDER — FERROUS SULFATE 325 (65 FE) MG PO TABS: 325.0000 mg | ORAL_TABLET | Freq: Every day | ORAL | Status: AC

## 2024-03-26 MED ORDER — SIMETHICONE 80 MG PO CHEW: 80.0000 mg | CHEWABLE_TABLET | ORAL | Status: AC | PRN

## 2024-03-26 MED ORDER — LEVOTHYROXINE SODIUM 100 MCG PO TABS: 100.0000 ug | ORAL_TABLET | Freq: Every day | ORAL | 0 refills | Status: AC

## 2024-03-26 MED ORDER — WITCH HAZEL-GLYCERIN EX PADS: 1.0000 | MEDICATED_PAD | CUTANEOUS | Status: AC | PRN

## 2024-03-26 MED ORDER — OXYCODONE HCL 5 MG PO TABS: 5.0000 mg | ORAL_TABLET | ORAL | 0 refills | Status: AC | PRN

## 2024-03-26 MED ORDER — ACETAMINOPHEN 500 MG PO TABS: 1000.0000 mg | ORAL_TABLET | Freq: Four times a day (QID) | ORAL | 0 refills | Status: AC

## 2024-03-26 MED ORDER — DIBUCAINE (PERIANAL) 1 % EX OINT: 1.0000 | TOPICAL_OINTMENT | CUTANEOUS | Status: AC | PRN

## 2024-03-26 MED ORDER — IBUPROFEN 600 MG PO TABS: 600.0000 mg | ORAL_TABLET | Freq: Four times a day (QID) | ORAL | 0 refills | Status: AC

## 2024-03-26 MED ORDER — SENNOSIDES-DOCUSATE SODIUM 8.6-50 MG PO TABS: 2.0000 | ORAL_TABLET | Freq: Every day | ORAL | Status: AC

## 2024-03-26 MED ORDER — COCONUT OIL OIL: 1.0000 | TOPICAL_OIL | Status: AC | PRN

## 2024-03-26 NOTE — Lactation Note (Signed)
 This note was copied from a baby's chart. Lactation Consultation Note  Patient Name: Alexandra [name] [id number] Date: 03/26/2024 Age:41 hours Reason for consult: Follow-up assessment;Early term 37-38.6wks;Maternal discharge 10% wt loss  Maternal Data Does the patient have breastfeeding experience prior to this delivery?: Yes How long did the patient breastfeed?: 2 mths  Feeding Mother's Current Feeding Choice: Breast Milk Nipple Type: Slow - flow Mom last pumped 20 cc EBM and FOB fed this to baby with slow flow nipple. Her breasts are starting to feel more full.  Mom has been offering breast first and then supplementing, but baby has had no interest in latching.  Mom is leaning more towards pumping right now to increase supply and increase baby's wt.  Recommend mom offer breast at least once per day with short attempts to not tire baby, more attempts if baby displaying cues, as baby gains wt and more stamina, try more attempts, may try nipple shield if needed. Continue to pump 8x/24 hrs, feed baby at least 30 cc today at each feeding, q2-3 hr.   EBM and formula if EBM not enough.  Gradually increase amts each day. When baby has matured more mom may call for [facility] consult to assist with latch to breast if baby having trouble.  To see ped on Monday, 4/14.    LATCH Score Latch:  (I did not observe a feeding)                  Lactation Tools Discussed/Used  LC name updated on white board Interventions Interventions: Education;DEBP  Discharge Discharge Education: Engorgement and breast care Pump: Personal;DEBP WIC Program: No  Consult Status Consult Status: PRN Date: 03/26/24 Follow-up type: In-patient    Leoma Raja 03/26/2024, 2:42 PM

## 2024-03-28 ENCOUNTER — Observation Stay
Admission: EM | Admit: 2024-03-28 | Discharge: 2024-03-28 | Disposition: A | Discharge status: Home / Self Care | Attending: Obstetrics and Gynecology | Admitting: Obstetrics and Gynecology

## 2024-03-28 DIAGNOSIS — E039 Hypothyroidism, unspecified: Secondary | ICD-10-CM | POA: Insufficient documentation

## 2024-03-28 DIAGNOSIS — Z79899 Other long term (current) drug therapy: Secondary | ICD-10-CM | POA: Diagnosis not present

## 2024-03-28 DIAGNOSIS — R030 Elevated blood-pressure reading, without diagnosis of hypertension: Principal | ICD-10-CM | POA: Diagnosis present

## 2024-03-28 DIAGNOSIS — O165 Unspecified maternal hypertension, complicating the puerperium: Secondary | ICD-10-CM | POA: Diagnosis present

## 2024-03-28 DIAGNOSIS — O99285 Endocrine, nutritional and metabolic diseases complicating the puerperium: Secondary | ICD-10-CM | POA: Diagnosis not present

## 2024-03-28 LAB — COMPREHENSIVE METABOLIC PANEL WITH GFR
ALT: 24 U/L (ref 0–44)
AST: 24 U/L (ref 15–41)
Albumin: 2.7 g/dL — ABNORMAL LOW (ref 3.5–5.0)
Alkaline Phosphatase: 81 U/L (ref 38–126)
Anion gap: 7 (ref 5–15)
BUN: 12 mg/dL (ref 6–20)
CO2: 24 mmol/L (ref 22–32)
Calcium: 9 mg/dL (ref 8.9–10.3)
Chloride: 107 mmol/L (ref 98–111)
Creatinine, Ser: 0.82 mg/dL (ref 0.44–1.00)
GFR, Estimated: >60 mL/min (ref >60)
Glucose, Bld: 89 mg/dL (ref 70–99)
Potassium: 4.2 mmol/L (ref 3.5–5.1)
Sodium: 138 mmol/L (ref 135–145)
Total Bilirubin: 0.5 mg/dL (ref 0.0–1.2)
Total Protein: 5.7 g/dL — ABNORMAL LOW (ref 6.5–8.1)

## 2024-03-28 LAB — CBC
HCT: 29.4 % — ABNORMAL LOW (ref 36.0–46.0)
Hemoglobin: 10.2 g/dL — ABNORMAL LOW (ref 12.0–15.0)
MCH: 32 pg (ref 26.0–34.0)
MCHC: 34.7 g/dL (ref 30.0–36.0)
MCV: 92.2 fL (ref 80.0–100.0)
Platelets: 252 10*3/uL (ref 150–400)
RBC: 3.19 MIL/uL — ABNORMAL LOW (ref 3.87–5.11)
RDW: 12.3 % (ref 11.5–15.5)
WBC: 5.8 10*3/uL (ref 4.0–10.5)
nRBC: 0 % (ref 0.0–0.2)

## 2024-03-28 LAB — PROTEIN / CREATININE RATIO, URINE
Creatinine, Urine: 33 mg/dL
Total Protein, Urine: <6 mg/dL

## 2024-03-28 MED ORDER — NIFEDIPINE ER OSMOTIC RELEASE 30 MG PO TB24
30.0000 mg | ORAL_TABLET | Freq: Every day | ORAL | Status: DC
  Administered 2024-03-28: 30 mg via ORAL
  Filled 2024-03-28: qty 1

## 2024-03-28 MED ORDER — CALCIUM CARBONATE ANTACID 500 MG PO CHEW: 2.0000 | CHEWABLE_TABLET | ORAL | Status: DC | PRN

## 2024-03-28 MED ORDER — ACETAMINOPHEN 325 MG PO TABS: 650.0000 mg | ORAL_TABLET | ORAL | Status: DC | PRN

## 2024-03-28 MED ORDER — NIFEDIPINE ER 30 MG PO TB24: 30.0000 mg | ORAL_TABLET | Freq: Every day | ORAL | 11 refills | Status: DC

## 2024-03-28 NOTE — Progress Notes (Signed)
 Discharge instructions reviewed with patient who verbalized understanding.  Patient denies further questions or concerns at this time.

## 2024-03-28 NOTE — OB Triage Note (Signed)
 Patient sent to L&D from [facility] office for evaluation due to elevated blood pressure. Patient delivered 4/10 cesarean delivery. Denies headache or RUQ pain.

## 2024-03-28 NOTE — Discharge Summary (Cosign Needed Addendum)
 Alexandra [name] is a 43 y.o. female. She is at 4 days postpartum. No LMP recorded. Estimated Date of Delivery: None noted.  Prenatal care site: [facility]   pregnancy complicated by:  - IVF pregnancy - cholestasis of pregnancy - obesity - AMA - thyroid disease - history of pre-eclampsia with G1 requiring readmission to the hospital - previous cesarean section - elevated BP without diagnosis of HTN - history of postpartum anxiety - abnormal AFP  Chief complaint: sent over from the office for elevated BP for a postpartum BP check. Initial BP in the office was severe-range and recheck was 140s/80s. She denies headaches, changes of vision, and/or RUQ pain. She previously took Procardia 30mg  XL in her previous pregnancy but has not taken it this pregnancy.   S: Resting comfortably.   Denies: HA, visual changes, SOB, or RUQ/epigastric pain  Maternal Medical History:   Past Medical History:  Diagnosis Date   AMA (advanced maternal age) multigravida 35+, third trimester    Anemia    Cholestasis during pregnancy in third trimester    Elevated blood pressure complicating pregnancy in third trimester, antepartum    GERD (gastroesophageal reflux disease)    Hypothyroidism    Miscarriage    Previous cesarean section     Past Surgical History:  Procedure Laterality Date   CESAREAN SECTION N/A 06/03/2020   Procedure: CESAREAN SECTION;  Surgeon: Schermerhorn, Ihor Austin, MD;  Location: [facility] ORS;  Service: Obstetrics;  Laterality: N/A;   CESAREAN SECTION N/A 03/24/2024   Procedure: CESAREAN DELIVERY;  Surgeon: Feliberto Gottron Ihor Austin, MD;  Location: [facility] ORS;  Service: Obstetrics;  Laterality: N/A;  REPEAT   Fertility Treatments      No Known Allergies  Prior to Admission medications   Medication Sig Start Date End Date Taking? Authorizing Provider  acetaminophen (TYLENOL) 500 MG tablet Take 2 tablets (1,000 mg total) by mouth every 6 (six) hours. 03/26/24   Chari Manning,  CNM  coconut oil OIL Apply 1 Application topically as needed. 03/26/24   Chari Manning, CNM  dibucaine (NUPERCAINAL) 1 % OINT Place 1 Application rectally as needed for hemorrhoids. 03/26/24   Chari Manning, CNM  ferrous sulfate 325 (65 FE) MG tablet Take 1 tablet (325 mg total) by mouth daily. 03/27/24   Chari Manning, CNM  ibuprofen (ADVIL) 600 MG tablet Take 1 tablet (600 mg total) by mouth every 6 (six) hours. 03/26/24   Chari Manning, CNM  levothyroxine (SYNTHROID) 100 MCG tablet Take 1 tablet (100 mcg total) by mouth daily at 6 (six) AM. 03/27/24 04/26/24  Chari Manning, CNM  NIFEdipine (ADALAT CC) 30 MG 24 hr tablet Take 1 tablet (30 mg total) by mouth daily. 03/28/24   [name], CNM  oxyCODONE (OXY IR/ROXICODONE) 5 MG immediate release tablet Take 1-2 tablets (5-10 mg total) by mouth every 4 (four) hours as needed for moderate pain (pain score 4-6). 03/26/24   Chari Manning, CNM  Prenatal Vit-Fe Fumarate-FA (PRENATAL MULTIVITAMIN) TABS tablet Take 1 tablet by mouth daily.    [provider]  senna-docusate (SENOKOT-S) 8.6-50 MG tablet Take 2 tablets by mouth daily. 03/27/24   Chari Manning, CNM  simethicone (MYLICON) 80 MG chewable tablet Chew 1 tablet (80 mg total) by mouth as needed for flatulence. 03/26/24   Chari Manning, CNM  witch hazel-glycerin (TUCKS) pad Apply 1 Application topically as needed for hemorrhoids. 03/26/24   Chari Manning, CNM      Social History: She  reports that she has never smoked.  She has never used smokeless tobacco. She reports that she does not drink alcohol and does not use drugs.  Family History: family history includes Hypertension in her father.  no history of gyn cancers  Review of Systems: A full review of systems was performed and negative except as noted in the HPI.     O:  BP (!) 153/82   Pulse 64   Resp 17  Vitals:   03/28/24 1433 03/28/24 1449 03/28/24 1509 03/28/24 1528  BP: (!)  156/90 (!) 144/79 131/83 (!) 145/80   03/28/24 1543 03/28/24 1558 03/28/24 1613 03/28/24 1628  BP: (!) 149/85 (!) 150/84 (!) 144/80 (!) 150/83   03/28/24 1643  BP: (!) 153/82    Results for orders placed or performed during the hospital encounter of 03/28/24 (from the past 48 hours)  Protein / creatinine ratio, urine   Collection Time: 03/28/24  2:37 PM  Result Value Ref Range   Creatinine, Urine 33 mg/dL   Total Protein, Urine <6 mg/dL   Protein Creatinine Ratio        0.00 - 0.15 mg/mg[Cre]  Comprehensive metabolic panel   Collection Time: 03/28/24  3:00 PM  Result Value Ref Range   Sodium 138 135 - 145 mmol/L   Potassium 4.2 3.5 - 5.1 mmol/L   Chloride 107 98 - 111 mmol/L   CO2 24 22 - 32 mmol/L   Glucose, Bld 89 70 - 99 mg/dL   BUN 12 6 - 20 mg/dL   Creatinine, Ser 2.95 0.44 - 1.00 mg/dL   Calcium 9.0 8.9 - 28.4 mg/dL   Total Protein 5.7 (L) 6.5 - 8.1 g/dL   Albumin 2.7 (L) 3.5 - 5.0 g/dL   AST 24 15 - 41 U/L   ALT 24 0 - 44 U/L   Alkaline Phosphatase 81 38 - 126 U/L   Total Bilirubin 0.5 0.0 - 1.2 mg/dL   GFR, Estimated >13 >24 mL/min   Anion gap 7 5 - 15  CBC   Collection Time: 03/28/24  3:00 PM  Result Value Ref Range   WBC 5.8 4.0 - 10.5 K/uL   RBC 3.19 (L) 3.87 - 5.11 MIL/uL   Hemoglobin 10.2 (L) 12.0 - 15.0 g/dL   HCT 40.1 (L) 02.7 - 25.3 %   MCV 92.2 80.0 - 100.0 fL   MCH 32.0 26.0 - 34.0 pg   MCHC 34.7 30.0 - 36.0 g/dL   RDW 66.4 40.3 - 47.4 %   Platelets 252 150 - 400 K/uL   nRBC 0.0 0.0 - 0.2 %     Constitutional: NAD, AAOx3  HE/ENT: extraocular movements grossly intact, moist mucous membranes CV: RRR PULM: nl respiratory effort, CTABL     Abd: gravid, non-tender, non-distended, soft , Incision dressing clean/dry/intact, covered with occlusive Honeycomb dressing, no redness or oozing present.     Ext: Non-tender, Nonedematous   Psych: mood appropriate, speech normal  A/P: 43 y.o. 4 days postpartum for antenatal surveillance for elevated BP  postpartum  Principle Diagnosis: Gestational HTN  Gestational HTN: pre-eclampsia labs WNL, pre-eclampsia not present. No severe-range BP. Start taking Procardia 30mg  XL daily and follow-up in the office in 2 days for BP check. She received first dose of Procardia prior to being discharged. Discussed pre-eclampsia s/s of headache, changes of vision, and/or RUQ pain and to return. Reviewed severe-range BP and when to return for evaluation.   D/c home stable, precautions reviewed, follow-up as scheduled.    [name], CNM 03/28/2024 5:15 PM

## 2024-03-30 ENCOUNTER — Encounter: Payer: Self-pay | Admitting: Emergency Medicine

## 2024-03-30 ENCOUNTER — Observation Stay
Admission: EM | Admit: 2024-03-30 | Discharge: 2024-03-31 | Disposition: A | Discharge status: Home / Self Care | Attending: Obstetrics and Gynecology | Admitting: Obstetrics and Gynecology

## 2024-03-30 ENCOUNTER — Other Ambulatory Visit: Payer: Self-pay

## 2024-03-30 DIAGNOSIS — O165 Unspecified maternal hypertension, complicating the puerperium: Secondary | ICD-10-CM | POA: Diagnosis present

## 2024-03-30 DIAGNOSIS — E039 Hypothyroidism, unspecified: Secondary | ICD-10-CM | POA: Insufficient documentation

## 2024-03-30 DIAGNOSIS — Z79899 Other long term (current) drug therapy: Secondary | ICD-10-CM | POA: Insufficient documentation

## 2024-03-30 DIAGNOSIS — O1495 Unspecified pre-eclampsia, complicating the puerperium: Secondary | ICD-10-CM | POA: Diagnosis not present

## 2024-03-30 DIAGNOSIS — O99285 Endocrine, nutritional and metabolic diseases complicating the puerperium: Secondary | ICD-10-CM | POA: Insufficient documentation

## 2024-03-30 LAB — CBC
HCT: 34.2 % — ABNORMAL LOW (ref 36.0–46.0)
HCT: 35.9 % — ABNORMAL LOW (ref 36.0–46.0)
Hemoglobin: 11.8 g/dL — ABNORMAL LOW (ref 12.0–15.0)
Hemoglobin: 12.4 g/dL (ref 12.0–15.0)
MCH: 32.2 pg (ref 26.0–34.0)
MCH: 31.2 pg (ref 26.0–34.0)
MCHC: 34.5 g/dL (ref 30.0–36.0)
MCHC: 34.5 g/dL (ref 30.0–36.0)
MCV: 93.2 fL (ref 80.0–100.0)
MCV: 90.4 fL (ref 80.0–100.0)
Platelets: 283 10*3/uL (ref 150–400)
Platelets: 330 10*3/uL (ref 150–400)
RBC: 3.67 MIL/uL — ABNORMAL LOW (ref 3.87–5.11)
RBC: 3.97 MIL/uL (ref 3.87–5.11)
RDW: 12.3 % (ref 11.5–15.5)
RDW: 12.1 % (ref 11.5–15.5)
WBC: 7.3 10*3/uL (ref 4.0–10.5)
WBC: 7.1 10*3/uL (ref 4.0–10.5)
nRBC: 0 % (ref 0.0–0.2)
nRBC: 0 % (ref 0.0–0.2)

## 2024-03-30 LAB — COMPREHENSIVE METABOLIC PANEL WITH GFR
ALT: 25 U/L (ref 0–44)
ALT: 22 U/L (ref 0–44)
AST: 20 U/L (ref 15–41)
AST: 22 U/L (ref 15–41)
Albumin: 3.1 g/dL — ABNORMAL LOW (ref 3.5–5.0)
Albumin: 3.1 g/dL — ABNORMAL LOW (ref 3.5–5.0)
Alkaline Phosphatase: 91 U/L (ref 38–126)
Alkaline Phosphatase: 91 U/L (ref 38–126)
Anion gap: 7 (ref 5–15)
Anion gap: 9 (ref 5–15)
BUN: 11 mg/dL (ref 6–20)
BUN: 12 mg/dL (ref 6–20)
CO2: 24 mmol/L (ref 22–32)
CO2: 24 mmol/L (ref 22–32)
Calcium: 9.1 mg/dL (ref 8.9–10.3)
Calcium: 8.5 mg/dL — ABNORMAL LOW (ref 8.9–10.3)
Chloride: 105 mmol/L (ref 98–111)
Chloride: 103 mmol/L (ref 98–111)
Creatinine, Ser: 0.73 mg/dL (ref 0.44–1.00)
Creatinine, Ser: 0.68 mg/dL (ref 0.44–1.00)
GFR, Estimated: >60 mL/min (ref >60)
GFR, Estimated: >60 mL/min (ref >60)
Glucose, Bld: 121 mg/dL — ABNORMAL HIGH (ref 70–99)
Glucose, Bld: 103 mg/dL — ABNORMAL HIGH (ref 70–99)
Potassium: 3.6 mmol/L (ref 3.5–5.1)
Potassium: 4.1 mmol/L (ref 3.5–5.1)
Sodium: 136 mmol/L (ref 135–145)
Sodium: 136 mmol/L (ref 135–145)
Total Bilirubin: 0.6 mg/dL (ref 0.0–1.2)
Total Bilirubin: 0.6 mg/dL (ref 0.0–1.2)
Total Protein: 6.6 g/dL (ref 6.5–8.1)
Total Protein: 6.7 g/dL (ref 6.5–8.1)

## 2024-03-30 LAB — MAGNESIUM: Magnesium: 4.9 mg/dL — ABNORMAL HIGH (ref 1.7–2.4)

## 2024-03-30 LAB — PROTEIN / CREATININE RATIO, URINE
Creatinine, Urine: 14 mg/dL
Total Protein, Urine: <6 mg/dL

## 2024-03-30 LAB — URIC ACID: Uric Acid, Serum: 4.1 mg/dL (ref 2.5–7.1)

## 2024-03-30 MED ORDER — LABETALOL HCL 5 MG/ML IV SOLN: 40.0000 mg | INTRAVENOUS | Status: DC | PRN

## 2024-03-30 MED ORDER — LABETALOL HCL 5 MG/ML IV SOLN: 20.0000 mg | INTRAVENOUS | Status: DC | PRN

## 2024-03-30 MED ORDER — ACETAMINOPHEN 500 MG PO TABS
1000.0000 mg | ORAL_TABLET | Freq: Four times a day (QID) | ORAL | Status: DC | PRN
  Administered 2024-03-30: 1000 mg via ORAL
  Filled 2024-03-30: qty 2

## 2024-03-30 MED ORDER — MAGNESIUM SULFATE BOLUS VIA INFUSION
4.0000 g | Freq: Once | INTRAVENOUS | Status: AC
  Administered 2024-03-30: 4 g via INTRAVENOUS
  Filled 2024-03-30: qty 1000

## 2024-03-30 MED ORDER — MAGNESIUM SULFATE 40 GM/1000ML IV SOLN
2.0000 g/h | INTRAVENOUS | Status: DC
  Administered 2024-03-30 – 2024-03-31 (×2): 2 g/h via INTRAVENOUS
  Filled 2024-03-30 (×2): qty 1000

## 2024-03-30 MED ORDER — HYDRALAZINE HCL 20 MG/ML IJ SOLN: 10.0000 mg | INTRAMUSCULAR | Status: DC | PRN

## 2024-03-30 MED ORDER — ESCITALOPRAM OXALATE 10 MG PO TABS
10.0000 mg | ORAL_TABLET | Freq: Every day | ORAL | Status: DC
  Administered 2024-03-30 – 2024-03-31 (×2): 10 mg via ORAL
  Filled 2024-03-30 (×2): qty 1

## 2024-03-30 MED ORDER — LABETALOL HCL 5 MG/ML IV SOLN: 80.0000 mg | INTRAVENOUS | Status: DC | PRN

## 2024-03-30 MED ORDER — MAGNESIUM OXIDE -MG SUPPLEMENT 400 (240 MG) MG PO TABS
800.0000 mg | ORAL_TABLET | Freq: Once | ORAL | Status: AC
  Administered 2024-03-30: 800 mg via ORAL
  Filled 2024-03-30: qty 2

## 2024-03-30 MED ORDER — NIFEDIPINE ER OSMOTIC RELEASE 30 MG PO TB24
60.0000 mg | ORAL_TABLET | Freq: Every day | ORAL | Status: DC
  Administered 2024-03-30 – 2024-03-31 (×2): 60 mg via ORAL
  Filled 2024-03-30 (×2): qty 2

## 2024-03-30 MED ORDER — CALCIUM CARBONATE ANTACID 500 MG PO CHEW: 2.0000 | CHEWABLE_TABLET | ORAL | Status: DC | PRN

## 2024-03-30 MED ORDER — CALCIUM GLUCONATE 10 % IV SOLN
INTRAVENOUS | Status: AC
  Filled 2024-03-30: qty 10

## 2024-03-30 MED ORDER — LEVOTHYROXINE SODIUM 100 MCG PO TABS
100.0000 ug | ORAL_TABLET | Freq: Every day | ORAL | Status: DC
  Administered 2024-03-31: 100 ug via ORAL
  Filled 2024-03-30 (×2): qty 1

## 2024-03-30 MED ORDER — LACTATED RINGERS IV SOLN: INTRAVENOUS | Status: AC

## 2024-03-30 MED ORDER — NIFEDIPINE ER OSMOTIC RELEASE 30 MG PO TB24: 60.0000 mg | ORAL_TABLET | Freq: Every day | ORAL | Status: DC

## 2024-03-30 NOTE — ED Triage Notes (Signed)
 Pt to triage via w/c with no distress noted; c-section on Thursday; c/o HTN, HA (158/83)

## 2024-03-30 NOTE — H&P (Signed)
 History of Present Illness:   Chief Complaint: elevated blood pressure   HPI:  Alexandra [name] is a 43 y.o. [id number] female at POD 5 following a repeat c/section on 03/24/2024 with Dr. Everitt Hoa who presented to the ED with complaints of elevated blood pressure and headache.  Alexandra [name] was discharged home in stable condition on 03/26/2024 and did not require oral antihypertensives prior to discharge.  She was seen in the office for blood pressure check on Monday and initially had a severe range blood pressure with repeat 140/80's. Preeclampsia labs at that time were WNL and she was started on Procardia. She started Procardia on 03/29/2024. She reports a headache in the evening and then elevated blood pressures at home. Blood pressure on arrival to ED was 163/100 with repeat in mild range. She did not have any blood pressures requiring treatment. She reports her headache improved after arrival to L&D.   Headache: 2/10 currently  Changes in vision: denies  RUQ pain: denies   Prenatal care site:  [facility]  Active Problems:   * No active hospital problems. *   Patient Active Problem List   Diagnosis Date Noted   Elevated blood pressure reading in office without diagnosis of hypertension 03/28/2024   Previous cesarean delivery affecting pregnancy 03/24/2024   AMA (advanced maternal age) multigravida 35+, third trimester 02/16/2024   Cholestasis during pregnancy in third trimester 02/16/2024   Obesity in pregnancy, antepartum 02/16/2024   Previous cesarean section 02/16/2024   Supervision of high risk pregnancy in first trimester 09/14/2023   Gestational hypertension 06/02/2020   Hypothyroidism 04/14/2008    Maternal Medical History:   Past Medical History:  Diagnosis Date   AMA (advanced maternal age) multigravida 35+, third trimester    Anemia    Cholestasis during pregnancy in third trimester    Elevated blood pressure complicating pregnancy in third trimester,  antepartum    GERD (gastroesophageal reflux disease)    Hypothyroidism    Miscarriage    Previous cesarean section     Past Surgical History:  Procedure Laterality Date   CESAREAN SECTION N/A 06/03/2020   Procedure: CESAREAN SECTION;  Surgeon: Schermerhorn, Joselyn Nicely, MD;  Location: [facility] ORS;  Service: Obstetrics;  Laterality: N/A;   CESAREAN SECTION N/A 03/24/2024   Procedure: CESAREAN DELIVERY;  Surgeon: Madelene Schanz Joselyn Nicely, MD;  Location: [facility] ORS;  Service: Obstetrics;  Laterality: N/A;  REPEAT   Fertility Treatments      No Known Allergies  Prior to Admission medications   Medication Sig Start Date End Date Taking? Authorizing Provider  acetaminophen (TYLENOL) 500 MG tablet Take 2 tablets (1,000 mg total) by mouth every 6 (six) hours. 03/26/24   Dickerson, Felicia, CNM  coconut oil OIL Apply 1 Application topically as needed. 03/26/24   Dickerson, Felicia, CNM  dibucaine (NUPERCAINAL) 1 % OINT Place 1 Application rectally as needed for hemorrhoids. 03/26/24   Dickerson, Felicia, CNM  ferrous sulfate 325 (65 FE) MG tablet Take 1 tablet (325 mg total) by mouth daily. 03/27/24   Dickerson, Felicia, CNM  ibuprofen (ADVIL) 600 MG tablet Take 1 tablet (600 mg total) by mouth every 6 (six) hours. 03/26/24   Dickerson, Felicia, CNM  levothyroxine (SYNTHROID) 100 MCG tablet Take 1 tablet (100 mcg total) by mouth daily at 6 (six) AM. 03/27/24 04/26/24  Dickerson, Felicia, CNM  NIFEdipine (ADALAT CC) 30 MG 24 hr tablet Take 1 tablet (30 mg total) by mouth daily. 03/28/24   Auston Left, CNM  oxyCODONE (OXY  IR/ROXICODONE) 5 MG immediate release tablet Take 1-2 tablets (5-10 mg total) by mouth every 4 (four) hours as needed for moderate pain (pain score 4-6). 03/26/24   Chari Manning, CNM  Prenatal Vit-Fe Fumarate-FA (PRENATAL MULTIVITAMIN) TABS tablet Take 1 tablet by mouth daily.    [provider]  senna-docusate (SENOKOT-S) 8.6-50 MG tablet Take 2 tablets by mouth daily.  03/27/24   Chari Manning, CNM  simethicone (MYLICON) 80 MG chewable tablet Chew 1 tablet (80 mg total) by mouth as needed for flatulence. 03/26/24   Chari Manning, CNM  witch hazel-glycerin (TUCKS) pad Apply 1 Application topically as needed for hemorrhoids. 03/26/24   Chari Manning, CNM    OB History  Gravida Para Term Preterm AB Living  4 2 2  0 2 2  SAB IAB Ectopic Multiple Live Births  1 0 0 0 2    # Outcome Date GA Lbr Len/2nd Weight Sex Type Anes PTL Lv  4 Term 03/24/24 [redacted]w[redacted]d  3100 g F CS-Vac Spinal  LIV     Name: Alexandra [name]     Apgar1: 9  Apgar5: 9  3 Term 06/03/20 [redacted]w[redacted]d  2680 g M CS-LTranv EPI, Spinal  LIV     Name: Alexandra [name],Alexandra [name]     Apgar1: 4  Apgar5: 8  2 SAB 2020 [redacted]w[redacted]d         1 AB 2005             Social History: She  reports that she has never smoked. She has never used smokeless tobacco. She reports that she does not drink alcohol and does not use drugs.  Family History: family history includes Hypertension in her father.   Review of Systems: A full review of systems was performed and negative except as noted in the HPI.     Physical Exam:  Vital Signs: BP 136/84   Pulse 64   Temp 97.6 F (36.4 C) (Oral)   Resp 18   Ht 5\' 5"  (1.651 m)   Wt 99 kg   SpO2 100%   BMI 36.32 kg/m  Physical Exam  General: no acute distress.  HEENT: normocephalic, atraumatic Heart: regular rate & rhythm.  No murmurs/rubs/gallops Lungs: clear to auscultation bilaterally, normal respiratory effort Abdomen: soft, gravid, non-tender; fundus firm, U/3, incision healing well, no erythema or significant drainage  Pelvic: deferred   Extremities: non-tender, symmetric, mild, dependent edema bilaterally.  DTRs: 2+/2+  Neurologic: Alert & oriented x 3.    Results for orders placed or performed during the hospital encounter of 03/30/24 (from the past 24 hours)  Protein / creatinine ratio, urine     Status: None   Collection Time: 03/30/24  1:16 AM  Result Value  Ref Range   Creatinine, Urine 14 mg/dL   Total Protein, Urine <6 mg/dL   Protein Creatinine Ratio        0.00 - 0.15 mg/mg[Cre]  Comprehensive metabolic panel     Status: Abnormal   Collection Time: 03/30/24  1:25 AM  Result Value Ref Range   Sodium 136 135 - 145 mmol/L   Potassium 3.6 3.5 - 5.1 mmol/L   Chloride 105 98 - 111 mmol/L   CO2 24 22 - 32 mmol/L   Glucose, Bld 121 (H) 70 - 99 mg/dL   BUN 11 6 - 20 mg/dL   Creatinine, Ser 9.56 0.44 - 1.00 mg/dL   Calcium 9.1 8.9 - 21.3 mg/dL   Total Protein 6.6 6.5 - 8.1 g/dL   Albumin 3.1 (  L) 3.5 - 5.0 g/dL   AST 20 15 - 41 U/L   ALT 25 0 - 44 U/L   Alkaline Phosphatase 91 38 - 126 U/L   Total Bilirubin 0.6 0.0 - 1.2 mg/dL   GFR, Estimated >78 >46 mL/min   Anion gap 7 5 - 15  CBC     Status: Abnormal   Collection Time: 03/30/24  1:25 AM  Result Value Ref Range   WBC 7.3 4.0 - 10.5 K/uL   RBC 3.67 (L) 3.87 - 5.11 MIL/uL   Hemoglobin 11.8 (L) 12.0 - 15.0 g/dL   HCT 96.2 (L) 95.2 - 84.1 %   MCV 93.2 80.0 - 100.0 fL   MCH 32.2 26.0 - 34.0 pg   MCHC 34.5 30.0 - 36.0 g/dL   RDW 32.4 40.1 - 02.7 %   Platelets 283 150 - 400 K/uL   nRBC 0.0 0.0 - 0.2 %  Uric acid     Status: None   Collection Time: 03/30/24  1:25 AM  Result Value Ref Range   Uric Acid, Serum 4.1 2.5 - 7.1 mg/dL    Assessment:  Akela Pocius is a 43 y.o. [id number] female with postpartum preeclampsia with severe features.   Plan:  1. Admit to Labor & Delivery; consents reviewed and obtained - Admission status: Observation - Dr Eustace Highland MD notified of admission and plan of care  - Reason for admission: blood pressure monitoring d/t gestational hypertension  - consents reviewed and obtained  2. Routine postpartum care  - Lactation consult PRN breastfeeding  - Continue routine postpartum medications  - Activity as tolerated  - Regular diet   3. Postpartum Hypertension  - preeclampsia labs WNL  - BP's normal to mild range  - Continue to monitor BP's to  assess for increases and severe range blood pressures  - Consider increasing Procardia to 60 mg daily  - Discussed we would recommend Mag sulfate infusion for any further severe range blood pressures, CNS symptoms, or labs consistent with preeclampsia    Alexandra [name], CNM 03/30/24 3:30 AM  Alexandra [name], CNM Certified Nurse Midwife [facility] [place]

## 2024-03-30 NOTE — Progress Notes (Signed)
 POSTPARTUM PROGRESS NOTE  Alexandra [name] is a 43 y.o. [id number] at 6 days postpartum who is admitted for postpartum preeclampsia with severe features.   Day of Delivery 03/24/2024  Length of Stay:  0 Days. Admitted 03/30/2024  Subjective: Patient patient denies headache, vision changes, or right epigastric pain. Discussed bilateral pitting edema which patient states has improved slightly since delivery.   Vitals:  BP (!) 150/85   Pulse (!) 56   Temp 98.5 F (36.9 C) (Oral)   Resp 16   Ht 5\' 5"  (1.651 m)   Wt 99 kg   SpO2 98%   BMI 36.32 kg/m   Vitals:   03/30/24 0230 03/30/24 0245 03/30/24 0300 03/30/24 0315  BP: 127/75 133/77 135/81 136/84   03/30/24 0415 03/30/24 0515 03/30/24 0620 03/30/24 0717  BP: 139/85 (!) 148/82 136/81 (!) 147/91   03/30/24 0817 03/30/24 0917 03/30/24 0947 03/30/24 1017  BP: 139/84 (!) 155/93 (!) 150/85 (!) 148/93     Physical Examination: General:   alert, cooperative, appears stated age, and no distress  Skin:  normal  Neurologic:    Alert & oriented x 3  Lungs:    Nml effort  Heart:   regular rate and rhythm  Abdomen:   deferred  Pelvis:  Exam deferred.  Extremities: : mild pitting edema bilaterally.     Results for orders placed or performed during the hospital encounter of 03/30/24 (from the past 48 hours)  Protein / creatinine ratio, urine     Status: None   Collection Time: 03/30/24  1:16 AM  Result Value Ref Range   Creatinine, Urine 14 mg/dL   Total Protein, Urine <6 mg/dL    Comment: NO NORMAL RANGE ESTABLISHED FOR THIS TEST   Protein Creatinine Ratio        0.00 - 0.15 mg/mg[Cre]    Comment: RESULT BELOW REPORTABLE RANGE, UNABLE TO CALCULATE. Performed at [facility], [address] [place], [place] [address]   Comprehensive metabolic panel     Status: Abnormal   Collection Time: 03/30/24  1:25 AM  Result Value Ref Range   Sodium 136 135 - 145 mmol/L   Potassium 3.6 3.5 - 5.1 mmol/L   Chloride 105 98 - 111 mmol/L    CO2 24 22 - 32 mmol/L   Glucose, Bld 121 (H) 70 - 99 mg/dL    Comment: Glucose reference range applies only to samples taken after fasting for at least 8 hours.   BUN 11 6 - 20 mg/dL   Creatinine, Ser 5.78 0.44 - 1.00 mg/dL   Calcium 9.1 8.9 - 46.9 mg/dL   Total Protein 6.6 6.5 - 8.1 g/dL   Albumin 3.1 (L) 3.5 - 5.0 g/dL   AST 20 15 - 41 U/L   ALT 25 0 - 44 U/L   Alkaline Phosphatase 91 38 - 126 U/L   Total Bilirubin 0.6 0.0 - 1.2 mg/dL   GFR, Estimated >62 >95 mL/min    Comment: (NOTE) Calculated using the CKD-EPI Creatinine Equation (2021)    Anion gap 7 5 - 15    Comment: Performed at [facility], [address] [place], [place] [address]  CBC     Status: Abnormal   Collection Time: 03/30/24  1:25 AM  Result Value Ref Range   WBC 7.3 4.0 - 10.5 K/uL   RBC 3.67 (L) 3.87 - 5.11 MIL/uL   Hemoglobin 11.8 (L) 12.0 - 15.0 g/dL   HCT 24.4 (L) 01.0 - 27.2 %  MCV 93.2 80.0 - 100.0 fL   MCH 32.2 26.0 - 34.0 pg   MCHC 34.5 30.0 - 36.0 g/dL   RDW 54.0 98.1 - 19.1 %   Platelets 283 150 - 400 K/uL   nRBC 0.0 0.0 - 0.2 %    Comment: Performed at [facility], [address] [place], [place] [address]  Uric acid     Status: None   Collection Time: 03/30/24  1:25 AM  Result Value Ref Range   Uric Acid, Serum 4.1 2.5 - 7.1 mg/dL    Comment: Performed at [facility], [address] [place], [place] [address]    No results found.  Current scheduled medications  escitalopram  10 mg Oral Daily   magnesium  4 g Intravenous Once   NIFEdipine  60 mg Oral Daily    I have reviewed the patient's current medications.  ASSESSMENT: Patient Active Problem List   Diagnosis Date Noted   Preeclampsia in postpartum period 03/30/2024   Elevated blood pressure reading in office without diagnosis of hypertension 03/28/2024   Previous cesarean delivery affecting pregnancy 03/24/2024   AMA (advanced maternal age) multigravida 35+, third trimester 02/16/2024    Cholestasis during pregnancy in third trimester 02/16/2024   Obesity in pregnancy, antepartum 02/16/2024   Previous cesarean section 02/16/2024   Supervision of high risk pregnancy in first trimester 09/14/2023   Gestational hypertension 06/02/2020   Hypothyroidism 04/14/2008    PLAN: 1. Routine postpartum care  - Lactation consult PRN breastfeeding  - Continue routine postpartum medications  - Activity as tolerated  - Regular diet    2. Postpartum Hypertension  - Discussed plan of care with Dr. Baker Bon. Schermerhorn - preeclampsia labs WNL  - Continue to monitor BP's - Procardia increased to 60 mg daily 03/30/24 at 0900 - Will start Mag Sulfate x24 hours  Jenifer E Theoren Palka, CNM 03/30/2024 10:13 AM

## 2024-03-30 NOTE — OB Triage Note (Signed)
 Pt arrived to OBS 1 from ER with complaints of headaches and increased blood pressure. Vital signs obtained. CNM notified of arrival

## 2024-03-31 DIAGNOSIS — O165 Unspecified maternal hypertension, complicating the puerperium: Secondary | ICD-10-CM | POA: Diagnosis not present

## 2024-03-31 LAB — COMPREHENSIVE METABOLIC PANEL WITH GFR
ALT: 19 U/L (ref 0–44)
AST: 17 U/L (ref 15–41)
Albumin: 3.2 g/dL — ABNORMAL LOW (ref 3.5–5.0)
Alkaline Phosphatase: 94 U/L (ref 38–126)
Anion gap: 11 (ref 5–15)
BUN: 14 mg/dL (ref 6–20)
CO2: 24 mmol/L (ref 22–32)
Calcium: 7.5 mg/dL — ABNORMAL LOW (ref 8.9–10.3)
Chloride: 100 mmol/L (ref 98–111)
Creatinine, Ser: 0.63 mg/dL (ref 0.44–1.00)
GFR, Estimated: >60 mL/min (ref >60)
Glucose, Bld: 109 mg/dL — ABNORMAL HIGH (ref 70–99)
Potassium: 3.6 mmol/L (ref 3.5–5.1)
Sodium: 135 mmol/L (ref 135–145)
Total Bilirubin: 0.7 mg/dL (ref 0.0–1.2)
Total Protein: 7.1 g/dL (ref 6.5–8.1)

## 2024-03-31 LAB — CBC
HCT: 38.2 % (ref 36.0–46.0)
Hemoglobin: 13.6 g/dL (ref 12.0–15.0)
MCH: 31.9 pg (ref 26.0–34.0)
MCHC: 35.6 g/dL (ref 30.0–36.0)
MCV: 89.5 fL (ref 80.0–100.0)
Platelets: 347 10*3/uL (ref 150–400)
RBC: 4.27 MIL/uL (ref 3.87–5.11)
RDW: 12.2 % (ref 11.5–15.5)
WBC: 6.3 10*3/uL (ref 4.0–10.5)
nRBC: 0 % (ref 0.0–0.2)

## 2024-03-31 MED ORDER — NIFEDIPINE ER OSMOTIC RELEASE 30 MG PO TB24
30.0000 mg | ORAL_TABLET | Freq: Once | ORAL | Status: AC
  Administered 2024-03-31: 30 mg via ORAL
  Filled 2024-03-31: qty 1

## 2024-03-31 MED ORDER — NIFEDIPINE ER OSMOTIC RELEASE 90 MG PO TB24: 90.0000 mg | ORAL_TABLET | Freq: Every day | ORAL | 0 refills | Status: AC

## 2024-03-31 MED ORDER — ESCITALOPRAM OXALATE 10 MG PO TABS
10.0000 mg | ORAL_TABLET | Freq: Every day | ORAL | 0 refills | Status: AC
Start: 2024-04-01 — End: 2024-05-01

## 2024-03-31 MED ORDER — NIFEDIPINE ER OSMOTIC RELEASE 30 MG PO TB24: 90.0000 mg | ORAL_TABLET | Freq: Every day | ORAL | Status: DC

## 2024-03-31 NOTE — Discharge Summary (Addendum)
 DC Summary Discharge Summary   Patient ID: Alexandra [name] [id number] 43 y.o. Dec 02, 1981  Admit date: 03/30/2024  Discharge date: 03/31/2024  Principal Diagnoses:  Elevated BP  Secondary Diagnoses:  Preeclampsia with severe features  Procedures performed during the hospitalization:  -IV magnesium Sulfate  HPI: Alexandra [name] is a 43 year old female, [id number], who is 7 days post-operative following a repeat cesarean section performed on March 24, 2024, by Dr. Rickey Primus. She presented to the emergency department with complaints of high blood pressure and a headache. Alexandra [name] was discharged in stable condition on March 26, 2024, without the need for oral antihypertensive medications prior to her discharge. She returned to the office for a blood pressure check on Monday, where she initially exhibited severely elevated blood pressure readings, consistently around 140/80. Laboratory tests for preeclampsia at that time were within normal limits, and she was prescribed Procardia, which she began taking on March 29, 2024. Alexandra [name] reported experiencing a headache in the evening, along with elevated blood pressure readings at home. Upon her arrival at the emergency department, her blood pressure was recorded at 163/100, with subsequent readings in the mild range. No blood pressure readings necessitated treatment. She noted that her headache improved after arriving at labor and delivery. On March 31, 2024, her Procardia dosage was increased to 90 mg daily due to ongoing mild range blood pressures, and the patient's symptoms have since resolved.   Headache: denies  Changes in vision: denies  RUQ pain: denies     Past Medical History:  Diagnosis Date   AMA (advanced maternal age) multigravida 35+, third trimester    Anemia    Cholestasis during pregnancy in third trimester    Elevated blood pressure complicating pregnancy in third trimester, antepartum    GERD (gastroesophageal reflux disease)     Hypothyroidism    Miscarriage    Previous cesarean section     Past Surgical History:  Procedure Laterality Date   CESAREAN SECTION N/A 06/03/2020   Procedure: CESAREAN SECTION;  Surgeon: Schermerhorn, Ihor Austin, MD;  Location: [facility] ORS;  Service: Obstetrics;  Laterality: N/A;   CESAREAN SECTION N/A 03/24/2024   Procedure: CESAREAN DELIVERY;  Surgeon: Feliberto Gottron Ihor Austin, MD;  Location: [facility] ORS;  Service: Obstetrics;  Laterality: N/A;  REPEAT   Fertility Treatments      No Known Allergies  Social History   Tobacco Use   Smoking status: Never   Smokeless tobacco: Never  Vaping Use   Vaping status: Never Used  Substance Use Topics   Alcohol use: Never   Drug use: Never    Family History  Problem Relation Age of Onset   Hypertension Father     Hospital Course:  -IV magnesium Sulfate x 24 hours -preeclampsia labs WNL -continuous monitoring of BP -Procardia increased to 90 mg daily -routine PP meds -lactation consult as needed  Discharge Exam: BP 136/81   Pulse 75   Temp 97.8 F (36.6 C) (Oral)   Resp 17   Ht 5\' 5"  (1.651 m)   Wt 99 kg   SpO2 96%   BMI 36.32 kg/m  Physical Exam Constitutional:      Appearance: Normal appearance.  HENT:     Head: Normocephalic and atraumatic.  Cardiovascular:     Rate and Rhythm: Normal rate and regular rhythm.  Pulmonary:     Effort: Pulmonary effort is normal.  Musculoskeletal:        General: Normal range of motion.     Cervical back: Normal range of motion  and neck supple.  Neurological:     Mental Status: She is alert and oriented to person, place, and time.     Sensory: Sensation is intact.     Motor: Motor function is intact. No seizure activity.     Coordination: Coordination is intact.     Gait: Gait is intact.     Deep Tendon Reflexes: Reflexes are normal and symmetric.  Skin:    General: Skin is warm and dry.  Psychiatric:        Mood and Affect: Mood normal.        Behavior: Behavior normal.         Thought Content: Thought content normal.        Judgment: Judgment normal.      Condition at Discharge: Stable  Complications affecting treatment: None  Discharge Medications:  Allergies as of 03/31/2024   No Known Allergies      Medication List     TAKE these medications    acetaminophen 500 MG tablet Commonly known as: TYLENOL Take 2 tablets (1,000 mg total) by mouth every 6 (six) hours.   coconut oil Oil Apply 1 Application topically as needed.   dibucaine 1 % Oint Commonly known as: NUPERCAINAL Place 1 Application rectally as needed for hemorrhoids.   escitalopram 10 MG tablet Commonly known as: LEXAPRO Take 1 tablet (10 mg total) by mouth daily. Start taking on: April 01, 2024   ferrous sulfate 325 (65 FE) MG tablet Take 1 tablet (325 mg total) by mouth daily.   ibuprofen 600 MG tablet Commonly known as: ADVIL Take 1 tablet (600 mg total) by mouth every 6 (six) hours.   levothyroxine 100 MCG tablet Commonly known as: SYNTHROID Take 1 tablet (100 mcg total) by mouth daily at 6 (six) AM.   NIFEdipine 90 MG 24 hr tablet Commonly known as: PROCARDIA XL/NIFEDICAL-XL Take 1 tablet (90 mg total) by mouth daily. Start taking on: April 01, 2024 What changed:  medication strength how much to take   oxyCODONE 5 MG immediate release tablet Commonly known as: Oxy IR/ROXICODONE Take 1-2 tablets (5-10 mg total) by mouth every 4 (four) hours as needed for moderate pain (pain score 4-6).   prenatal multivitamin Tabs tablet Take 1 tablet by mouth daily.   senna-docusate 8.6-50 MG tablet Commonly known as: Senokot-S Take 2 tablets by mouth daily.   simethicone 80 MG chewable tablet Commonly known as: MYLICON Chew 1 tablet (80 mg total) by mouth as needed for flatulence.   witch hazel-glycerin pad Commonly known as: TUCKS Apply 1 Application topically as needed for hemorrhoids.         Follow-up arrangements:   Follow-up Information     Schermerhorn,  Joselyn Nicely, MD. Go on 04/04/2024.   Specialty: Obstetrics and Gynecology Why: 3:15 pm Contact information: [address] [place] [address] [phone]                  Discharge Disposition: Discharge disposition: 01-Home or Self Care  F/U on Monday 04/04/24 with Dr. Elva Hamburger. Schermerhorn  Signed: Arzella Laurence CNM  03/31/2024 2:56 PM

## 2024-03-31 NOTE — Progress Notes (Signed)
 Pt given discharge instructions including follow up care, medications, and reasons to follow up. Pt verbalizes understanding and taken via wheelchair to medical mall. Pt discharged with husband.

## 2024-05-16 ENCOUNTER — Other Ambulatory Visit: Payer: Self-pay | Admitting: Obstetrics and Gynecology

## 2024-05-16 DIAGNOSIS — Z1231 Encounter for screening mammogram for malignant neoplasm of breast: Secondary | ICD-10-CM

## 2024-06-14 ENCOUNTER — Ambulatory Visit
Admission: RE | Admit: 2024-06-14 | Discharge: 2024-06-14 | Disposition: A | Discharge status: Home / Self Care | Source: Ambulatory Visit | Attending: Obstetrics and Gynecology | Admitting: Obstetrics and Gynecology

## 2024-06-14 DIAGNOSIS — Z1231 Encounter for screening mammogram for malignant neoplasm of breast: Secondary | ICD-10-CM | POA: Insufficient documentation

## 2025-01-11 ENCOUNTER — Encounter: Attending: Nurse Practitioner | Admitting: Dietician

## 2025-01-11 ENCOUNTER — Encounter: Payer: Self-pay | Admitting: Dietician

## 2025-01-11 VITALS — Ht 65.0 in | Wt 204.0 lb

## 2025-01-11 DIAGNOSIS — E669 Obesity, unspecified: Secondary | ICD-10-CM | POA: Insufficient documentation

## 2025-01-11 DIAGNOSIS — Z713 Dietary counseling and surveillance: Secondary | ICD-10-CM | POA: Insufficient documentation

## 2025-01-11 NOTE — Patient Instructions (Addendum)
 Try Barilla PROTEIN pasta or Chickpea pasta for a higher protein.   Try Yogurt Bites for toddlers, Squeeze Apple Sauce, GoGo Squeeze Yogurt, Built PUFF protein bars, Fairlife Nutrition Plan for high protein, low-fat snack options during your workday.  Prepare a couple of wraps with low-carb tortillas and lots of veggies for lunch a few days a week. Add in hummus, low fat cheese, low-sodium pack tuna, or even tofu for protein!  Choose Steam-Fresh or Steam-In-Bag store brand frozen vegetables as part of your lunch options at work.

## 2025-01-11 NOTE — Progress Notes (Signed)
 Medical Nutrition Therapy  Appointment Start time:  1110  Appointment End time:  1235  Primary concerns today: Weight loss, Improve health, More energy  Referral diagnosis: E66.9 (ICD-10-CM) - Obesity, unspecified  Preferred learning style: No preference indicated Learning readiness: Contemplating   NUTRITION ASSESSMENT   Anthropometrics Ht: 65 Wt: 204 lbs (Self-Reported) BMI: 33.95 kg/m2  Clinical Medical Hx: Preeclampisa, Hypothyroidism, Obesity Medications: Levothyroxine  Labs: Reviewed Notable Signs/Symptoms: N/A   Lifestyle & Dietary Hx Pt reports concerns over health after having first child 4 years ago, reports goal of losing weight and staying healthy for their children. Pt reports undesired weight gain during 9 years of fertility treatment in their 30s and has had trouble losing pregnancy weight since last child was born 9 months ago. Pt reports having a busy work schedule, usually misses lunch due to meetings and feels very hungry when they get home, may reach for sweet/salty foods (chocolate, chips) an overindulge while cooking dinner. Pt reports difficulty with pressure, states they can be hard on themself. Pt states that they feel like if they have to lose weight and may crash diet for a week and get discouraged when unable to keep up the changes. Pt states they enjoy being physically active, especially cardio, previously did yoga/zumba/dancing prior to pregnancy, finds it too difficult to have time to exercise regularly now.   Estimated daily fluid intake: 32-48 oz Supplements: Women's MVI Sleep: Moderately well Stress / self-care: High Stress, job/family pressures Current average weekly physical activity: ADLs, ~10,000 steps most days  24-Hr Dietary Recall First Meal: 1 dinner roll w/ PB, water Snack:  Second Meal: Small bowl of BBQ potato chips, Diet Coke Snack:  Third Meal: 2 slices of [employer] veggie pizza Snack:  Beverages: Water, Diet  Coke   NUTRITION DIAGNOSIS  NI-1.5 Excessive energy intake As related to dietary pattern.  As evidenced by unintentional weight gain, skipping meals and overindulging on energy dense foods as a result, minimal structured activity.SABRA   NUTRITION INTERVENTION  Nutrition education (E-1) on the following topics:  Educated patient on the two components of energy balance: Energy in (calories), and energy out (activity). Explain the role of negative energy balance in weight loss. Discussed options with patient to achieve a negative energy balance and how to best control energy in and energy out to accommodate their lifestyle. Counseled patient on beginning to rebuild their trust in themselves to make the right food choices for their health. Educated patient on mindful eating, including listening to their body's hunger and satiety cues, as well as eating slowly and allowing meals to be more of a sensory experience. Counseled patient on allowing themselves to be present in their emotions when they consider emotional eating. Advised patient to evaluate whether the impulse to eat is hunger based, or emotionally driven.   Handouts Provided Include  [facility] Schedule  Learning Style & Readiness for Change Teaching method utilized: Visual & Auditory  Demonstrated degree of understanding via: Teach Back  Barriers to learning/adherence to lifestyle change: Busy schedule  Goals Established by Pt Try Barilla PROTEIN pasta or Chickpea pasta for a higher protein.  Try Yogurt Bites for toddlers, Squeeze Apple Sauce, GoGo Squeeze Yogurt, Built PUFF protein bars, Fairlife Nutrition Plan for high protein, low-fat snack options during your workday. Prepare a couple of wraps with low-carb tortillas and lots of veggies for lunch a few days a week. Add in hummus, low fat cheese, low-sodium pack tuna, or even tofu for protein! Choose Steam-Fresh or  Steam-In-Bag store brand frozen vegetables as part of your lunch  options at work.   MONITORING & EVALUATION Dietary intake, weekly physical activity, and weight change in 6 weeks.  Next Steps  Patient is to follow up with RD.

## 2025-02-17 ENCOUNTER — Encounter: Admitting: Dietician
